# Patient Record
Sex: Female | Born: 1985 | Race: White | Hispanic: No | Marital: Married | Smoking: Never smoker
Health system: Southern US, Community
[De-identification: ages and names within clinical notes are randomized; demographics above are authoritative.]

## PROBLEM LIST (undated history)

## (undated) DIAGNOSIS — Z8619 Personal history of other infectious and parasitic diseases: Secondary | ICD-10-CM

## (undated) DIAGNOSIS — Z789 Other specified health status: Secondary | ICD-10-CM

## (undated) DIAGNOSIS — E785 Hyperlipidemia, unspecified: Secondary | ICD-10-CM

## (undated) HISTORY — DX: Personal history of other infectious and parasitic diseases: Z86.19

## (undated) HISTORY — DX: Hyperlipidemia, unspecified: E78.5

---

## 2006-03-11 ENCOUNTER — Other Ambulatory Visit: Admission: RE | Admit: 2006-03-11 | Discharge: 2006-03-11 | Payer: Self-pay | Admitting: Family Medicine

## 2007-03-13 ENCOUNTER — Other Ambulatory Visit: Admission: RE | Admit: 2007-03-13 | Discharge: 2007-03-13 | Payer: Self-pay | Admitting: Family Medicine

## 2008-04-14 ENCOUNTER — Other Ambulatory Visit: Admission: RE | Admit: 2008-04-14 | Discharge: 2008-04-14 | Payer: Self-pay | Admitting: Family Medicine

## 2009-05-10 ENCOUNTER — Other Ambulatory Visit: Admission: RE | Admit: 2009-05-10 | Discharge: 2009-05-10 | Payer: Self-pay | Admitting: Family Medicine

## 2010-05-29 ENCOUNTER — Other Ambulatory Visit: Admission: RE | Admit: 2010-05-29 | Discharge: 2010-05-29 | Payer: Self-pay | Admitting: Family Medicine

## 2013-06-08 ENCOUNTER — Other Ambulatory Visit (HOSPITAL_COMMUNITY)
Admission: RE | Admit: 2013-06-08 | Discharge: 2013-06-08 | Disposition: A | Discharge status: Home / Self Care | Payer: BC Managed Care – PPO | Source: Ambulatory Visit | Attending: Family Medicine | Admitting: Family Medicine

## 2013-06-08 ENCOUNTER — Other Ambulatory Visit: Payer: Self-pay | Admitting: Family Medicine

## 2013-06-08 DIAGNOSIS — Z1151 Encounter for screening for human papillomavirus (HPV): Secondary | ICD-10-CM | POA: Insufficient documentation

## 2013-06-08 DIAGNOSIS — Z01419 Encounter for gynecological examination (general) (routine) without abnormal findings: Secondary | ICD-10-CM | POA: Insufficient documentation

## 2013-11-05 HISTORY — PX: OTHER SURGICAL HISTORY: SHX169

## 2013-11-05 NOTE — L&D Delivery Note (Signed)
Delivery Note At 11:05 AM a viable female was delivered via Vaginal, Spontaneous Delivery (Presentation: Occiput Anterior).  APGAR: 9, 9; weight pending .   Placenta status: Intact, Spontaneous.  Cord: 3 vessels with the following complications: None.  Cord pH: n/a  Anesthesia: None  Episiotomy: None Lacerations: 3rd degree.  Hymenal ring lacerated in pt's right. Suture Repair: 2.0 3.0 chromic, 0.0 Vicryl Est. Blood Loss (mL): 450  I/O of bladder prior to repair with sterile technique.  100 cc returned. Rectal exam done pre and post repair.  No suture in rectum.  Mom to postpartum.  Baby to Couplet care / Skin to Skin.  Geryl RankinsVARNADO, Karim Aiello 08/08/2014, 11:49 AM

## 2014-01-01 LAB — OB RESULTS CONSOLE RUBELLA ANTIBODY, IGM: Rubella: IMMUNE

## 2014-01-01 LAB — OB RESULTS CONSOLE ANTIBODY SCREEN: Antibody Screen: NEGATIVE

## 2014-01-01 LAB — OB RESULTS CONSOLE HEPATITIS B SURFACE ANTIGEN: Hepatitis B Surface Ag: NEGATIVE

## 2014-01-01 LAB — OB RESULTS CONSOLE ABO/RH: RH Type: POSITIVE

## 2014-01-01 LAB — OB RESULTS CONSOLE RPR: RPR: NONREACTIVE

## 2014-01-01 LAB — OB RESULTS CONSOLE HIV ANTIBODY (ROUTINE TESTING): HIV: NONREACTIVE

## 2014-01-07 LAB — OB RESULTS CONSOLE GC/CHLAMYDIA
Chlamydia: NEGATIVE
Gonorrhea: NEGATIVE

## 2014-08-06 ENCOUNTER — Telehealth (HOSPITAL_COMMUNITY): Payer: Self-pay | Admitting: *Deleted

## 2014-08-06 ENCOUNTER — Encounter (HOSPITAL_COMMUNITY): Payer: Self-pay | Admitting: *Deleted

## 2014-08-06 LAB — OB RESULTS CONSOLE GBS: GBS: NEGATIVE

## 2014-08-06 NOTE — Telephone Encounter (Signed)
Preadmission screen  

## 2014-08-06 NOTE — H&P (Signed)
HPI: 28 y/o G1P0 @ 7257w0d estimated gestational age (as dated by LMP c/w 10 week ultrasound) presents for IOL due to postdates.   no Leaking of Fluid,   no Vaginal Bleeding,   irregular Uterine Contractions,  + Fetal Movement.  ROS: no HA, no epigastric pain, no visual changes.    Pregnancy complicated by: 1) ALL PCN/Ceclor   Prenatal Transfer Tool  Maternal Diabetes: No Genetic Screening: Normal Maternal Ultrasounds/Referrals: Abnormal:  Findings:   Isolated choroid plexus cyst Fetal Ultrasounds or other Referrals:  None Maternal Substance Abuse:  No Significant Maternal Medications:  None Significant Maternal Lab Results: Lab values include: Group B Strep negative   PNL:  GBS negative, Rub Immune, Hep B neg, RPR NR, HIV neg, GC/C neg (March 2015), glucola:82 Hgb 12.2, Received Tdap on 05/26/14.   Blood type: A positive  OBHx: primip PMHx:  none Meds:  PNV Allergy:  Allergies  Allergen Reactions  . Amoxicillin   . Ceclor [Cefaclor]    SurgHx: none SocHx:   no Tobacco, no  EtOH, no Illicit Drugs  O: LMP 10/26/2013 Examination performed in office on 10/1 BP: 122/87 Gen. AAOx3, NAD CV.  RRR  No murmur.  Resp. CTAB, no wheeze or crackles. Abd. Gravid,  no tenderness,  no rigidity,  no guarding Extr.  1+ non-pitting edema B/L , no calf tenderness bilaterally  FHT: 135bpm SVE: 3/75/-2, vertex  Labs: see orders  A/P:  28 y.o. G1P0 @ 10557w0d EGA who presents for induction of labor. -FWB- reassuring by doppler -Induction of labor: will induce with Pitocin -GBS: negative -CBC, T&S to be collected  Myna HidalgoJennifer Jerre Diguglielmo, DO [phone] (pager) [phone] (office)

## 2014-08-08 ENCOUNTER — Encounter (HOSPITAL_COMMUNITY): Payer: BC Managed Care – PPO | Admitting: Anesthesiology

## 2014-08-08 ENCOUNTER — Inpatient Hospital Stay (HOSPITAL_COMMUNITY): Payer: BC Managed Care – PPO | Admitting: Anesthesiology

## 2014-08-08 ENCOUNTER — Encounter (HOSPITAL_COMMUNITY): Payer: Self-pay | Admitting: *Deleted

## 2014-08-08 ENCOUNTER — Inpatient Hospital Stay (HOSPITAL_COMMUNITY)
Admission: AD | Admit: 2014-08-08 | Discharge: 2014-08-10 | DRG: 989 | Disposition: A | Discharge status: Home / Self Care | Payer: BC Managed Care – PPO | Source: Ambulatory Visit | Attending: Obstetrics & Gynecology | Admitting: Obstetrics & Gynecology

## 2014-08-08 DIAGNOSIS — Z3403 Encounter for supervision of normal first pregnancy, third trimester: Secondary | ICD-10-CM | POA: Diagnosis present

## 2014-08-08 DIAGNOSIS — Z3A40 40 weeks gestation of pregnancy: Secondary | ICD-10-CM | POA: Diagnosis present

## 2014-08-08 HISTORY — DX: Other specified health status: Z78.9

## 2014-08-08 LAB — CBC
HCT: 39.4 % (ref 36.0–46.0)
Hemoglobin: 13.9 g/dL (ref 12.0–15.0)
MCH: 31.2 pg (ref 26.0–34.0)
MCHC: 35.3 g/dL (ref 30.0–36.0)
MCV: 88.3 fL (ref 78.0–100.0)
Platelets: 181 10*3/uL (ref 150–400)
RBC: 4.46 MIL/uL (ref 3.87–5.11)
RDW: 13 % (ref 11.5–15.5)
WBC: 10.3 10*3/uL (ref 4.0–10.5)

## 2014-08-08 MED ORDER — FENTANYL 2.5 MCG/ML BUPIVACAINE 1/10 % EPIDURAL INFUSION (WH - ANES)
INTRAMUSCULAR | Status: DC | PRN
  Administered 2014-08-08: 14 mL/h via EPIDURAL

## 2014-08-08 MED ORDER — IBUPROFEN 600 MG PO TABS
600.0000 mg | ORAL_TABLET | Freq: Four times a day (QID) | ORAL | Status: DC
  Administered 2014-08-08 – 2014-08-10 (×8): 600 mg via ORAL
  Filled 2014-08-08 (×8): qty 1

## 2014-08-08 MED ORDER — MAGNESIUM HYDROXIDE 400 MG/5ML PO SUSP: 30.0000 mL | ORAL | Status: DC | PRN

## 2014-08-08 MED ORDER — TERBUTALINE SULFATE 1 MG/ML IJ SOLN: 0.2500 mg | Freq: Once | INTRAMUSCULAR | Status: DC | PRN

## 2014-08-08 MED ORDER — LANOLIN HYDROUS EX OINT: TOPICAL_OINTMENT | CUTANEOUS | Status: DC | PRN

## 2014-08-08 MED ORDER — CITRIC ACID-SODIUM CITRATE 334-500 MG/5ML PO SOLN: 30.0000 mL | ORAL | Status: DC | PRN

## 2014-08-08 MED ORDER — WITCH HAZEL-GLYCERIN EX PADS: 1.0000 "application " | MEDICATED_PAD | CUTANEOUS | Status: DC | PRN

## 2014-08-08 MED ORDER — LACTATED RINGERS IV SOLN
INTRAVENOUS | Status: DC
Start: 2014-08-08 — End: 2014-08-08
  Administered 2014-08-08 (×2): via INTRAVENOUS

## 2014-08-08 MED ORDER — DIBUCAINE 1 % RE OINT: 1.0000 "application " | TOPICAL_OINTMENT | RECTAL | Status: DC | PRN

## 2014-08-08 MED ORDER — OXYTOCIN BOLUS FROM INFUSION: 500.0000 mL | INTRAVENOUS | Status: DC

## 2014-08-08 MED ORDER — PHENYLEPHRINE 40 MCG/ML (10ML) SYRINGE FOR IV PUSH (FOR BLOOD PRESSURE SUPPORT)
80.0000 ug | PREFILLED_SYRINGE | INTRAVENOUS | Status: DC | PRN
  Filled 2014-08-08: qty 2

## 2014-08-08 MED ORDER — INFLUENZA VAC SPLIT QUAD 0.5 ML IM SUSY: 0.5000 mL | PREFILLED_SYRINGE | INTRAMUSCULAR | Status: DC

## 2014-08-08 MED ORDER — OXYTOCIN 40 UNITS IN LACTATED RINGERS INFUSION - SIMPLE MED
1.0000 m[IU]/min | INTRAVENOUS | Status: DC
  Administered 2014-08-08: 2 m[IU]/min via INTRAVENOUS

## 2014-08-08 MED ORDER — LIDOCAINE HCL (PF) 1 % IJ SOLN
30.0000 mL | INTRAMUSCULAR | Status: DC | PRN
  Filled 2014-08-08: qty 30

## 2014-08-08 MED ORDER — OXYCODONE-ACETAMINOPHEN 5-325 MG PO TABS
2.0000 | ORAL_TABLET | ORAL | Status: DC | PRN
  Filled 2014-08-08: qty 2

## 2014-08-08 MED ORDER — DIPHENHYDRAMINE HCL 50 MG/ML IJ SOLN: 12.5000 mg | INTRAMUSCULAR | Status: DC | PRN

## 2014-08-08 MED ORDER — DIPHENHYDRAMINE HCL 25 MG PO CAPS: 25.0000 mg | ORAL_CAPSULE | Freq: Four times a day (QID) | ORAL | Status: DC | PRN

## 2014-08-08 MED ORDER — ACETAMINOPHEN 325 MG PO TABS: 650.0000 mg | ORAL_TABLET | ORAL | Status: DC | PRN

## 2014-08-08 MED ORDER — OXYTOCIN 40 UNITS IN LACTATED RINGERS INFUSION - SIMPLE MED: 62.5000 mL/h | INTRAVENOUS | Status: DC | PRN

## 2014-08-08 MED ORDER — SENNOSIDES-DOCUSATE SODIUM 8.6-50 MG PO TABS
2.0000 | ORAL_TABLET | ORAL | Status: DC
  Administered 2014-08-08 – 2014-08-09 (×2): 2 via ORAL
  Filled 2014-08-08 (×2): qty 2

## 2014-08-08 MED ORDER — BENZOCAINE-MENTHOL 20-0.5 % EX AERO
1.0000 "application " | INHALATION_SPRAY | CUTANEOUS | Status: DC | PRN
  Administered 2014-08-08 – 2014-08-10 (×2): 1 via TOPICAL
  Filled 2014-08-08 (×2): qty 56

## 2014-08-08 MED ORDER — ZOLPIDEM TARTRATE 5 MG PO TABS: 5.0000 mg | ORAL_TABLET | Freq: Every evening | ORAL | Status: DC | PRN

## 2014-08-08 MED ORDER — PHENYLEPHRINE 40 MCG/ML (10ML) SYRINGE FOR IV PUSH (FOR BLOOD PRESSURE SUPPORT)
80.0000 ug | PREFILLED_SYRINGE | INTRAVENOUS | Status: DC | PRN
  Filled 2014-08-08: qty 2
  Filled 2014-08-08: qty 10

## 2014-08-08 MED ORDER — PRENATAL MULTIVITAMIN CH
1.0000 | ORAL_TABLET | Freq: Every day | ORAL | Status: DC
  Administered 2014-08-10: 1 via ORAL
  Filled 2014-08-08: qty 1

## 2014-08-08 MED ORDER — METHYLERGONOVINE MALEATE 0.2 MG PO TABS: 0.2000 mg | ORAL_TABLET | ORAL | Status: DC | PRN

## 2014-08-08 MED ORDER — EPHEDRINE 5 MG/ML INJ
10.0000 mg | INTRAVENOUS | Status: DC | PRN
  Filled 2014-08-08: qty 2

## 2014-08-08 MED ORDER — TETANUS-DIPHTH-ACELL PERTUSSIS 5-2.5-18.5 LF-MCG/0.5 IM SUSP: 0.5000 mL | Freq: Once | INTRAMUSCULAR | Status: DC

## 2014-08-08 MED ORDER — METHYLERGONOVINE MALEATE 0.2 MG/ML IJ SOLN: 0.2000 mg | INTRAMUSCULAR | Status: DC | PRN

## 2014-08-08 MED ORDER — BUTORPHANOL TARTRATE 1 MG/ML IJ SOLN
1.0000 mg | Freq: Once | INTRAMUSCULAR | Status: AC
  Administered 2014-08-08: 1 mg via INTRAVENOUS
  Filled 2014-08-08: qty 1

## 2014-08-08 MED ORDER — OXYTOCIN 40 UNITS IN LACTATED RINGERS INFUSION - SIMPLE MED: 1.0000 m[IU]/min | INTRAVENOUS | Status: DC

## 2014-08-08 MED ORDER — SIMETHICONE 80 MG PO CHEW: 80.0000 mg | CHEWABLE_TABLET | ORAL | Status: DC | PRN

## 2014-08-08 MED ORDER — ONDANSETRON HCL 4 MG/2ML IJ SOLN: 4.0000 mg | INTRAMUSCULAR | Status: DC | PRN

## 2014-08-08 MED ORDER — OXYCODONE-ACETAMINOPHEN 5-325 MG PO TABS: 2.0000 | ORAL_TABLET | ORAL | Status: DC | PRN

## 2014-08-08 MED ORDER — ONDANSETRON HCL 4 MG PO TABS: 4.0000 mg | ORAL_TABLET | ORAL | Status: DC | PRN

## 2014-08-08 MED ORDER — HYDROXYZINE HCL 50 MG PO TABS
50.0000 mg | ORAL_TABLET | Freq: Four times a day (QID) | ORAL | Status: DC | PRN
  Filled 2014-08-08: qty 1

## 2014-08-08 MED ORDER — FENTANYL 2.5 MCG/ML BUPIVACAINE 1/10 % EPIDURAL INFUSION (WH - ANES): 14.0000 mL/h | INTRAMUSCULAR | Status: DC | PRN

## 2014-08-08 MED ORDER — ONDANSETRON HCL 4 MG/2ML IJ SOLN
4.0000 mg | Freq: Four times a day (QID) | INTRAMUSCULAR | Status: DC | PRN
  Filled 2014-08-08: qty 2

## 2014-08-08 MED ORDER — LIDOCAINE HCL (PF) 1 % IJ SOLN
INTRAMUSCULAR | Status: DC | PRN
  Administered 2014-08-08 (×2): 4 mL

## 2014-08-08 MED ORDER — LACTATED RINGERS IV SOLN
500.0000 mL | Freq: Once | INTRAVENOUS | Status: AC
  Administered 2014-08-08: 500 mL via INTRAVENOUS

## 2014-08-08 MED ORDER — LACTATED RINGERS IV SOLN
500.0000 mL | INTRAVENOUS | Status: DC | PRN
  Administered 2014-08-08: 500 mL via INTRAVENOUS

## 2014-08-08 MED ORDER — OXYCODONE-ACETAMINOPHEN 5-325 MG PO TABS: 1.0000 | ORAL_TABLET | ORAL | Status: DC | PRN

## 2014-08-08 MED ORDER — FENTANYL 2.5 MCG/ML BUPIVACAINE 1/10 % EPIDURAL INFUSION (WH - ANES)
14.0000 mL/h | INTRAMUSCULAR | Status: DC | PRN
  Filled 2014-08-08: qty 125

## 2014-08-08 MED ORDER — OXYTOCIN 40 UNITS IN LACTATED RINGERS INFUSION - SIMPLE MED
62.5000 mL/h | INTRAVENOUS | Status: DC
  Filled 2014-08-08: qty 1000

## 2014-08-08 MED ORDER — OXYCODONE-ACETAMINOPHEN 5-325 MG PO TABS
1.0000 | ORAL_TABLET | ORAL | Status: DC | PRN
  Administered 2014-08-08 – 2014-08-10 (×6): 1 via ORAL
  Filled 2014-08-08 (×6): qty 1

## 2014-08-08 NOTE — MAU Note (Signed)
PT ARRIVED IN LOBBY - VERY UNCOMFORTABLE-  TO RM 7.  FHR- 129.  SROM  AT 0115- CLEAR FLUID.   GBS- NEG.  VE- 8 CM  90 %, VTX  , 0 STATION.  TO RM 168- VIA STRETCHER.

## 2014-08-08 NOTE — H&P (Signed)
Cynthia HarpinKristin Farmer is a 28 y.o. female G1 at 6040 6/7 weeks presenting with c/o LOF.  Pt had a few contractions overnight but was able to go to sleep.  She was awakened by LOF.  Noted vaginal bleeding/spotting.  Contractions are very painful.   Pt's birth plan was to labor without IV medication or an epidural.  Pt currently declines both.  PNC uncomplicated per Dr. Charlotta Newtonzan.  She was scheduled for IOL tomorrow.  Please see Dr. Lawana Chamberszan's H&P for additional details. Maternal Medical History:  Reason for admission: Rupture of membranes.   Contractions: Onset was 1-2 hours ago.   Frequency: regular.   Perceived severity is strong.    Prenatal complications: no prenatal complications Prenatal Complications - Diabetes: none.    OB History   Grav Para Term Preterm Abortions TAB SAB Ect Mult Living   2              Past Medical History  Diagnosis Date  . Hx of varicella   . Medical history non-contributory    History reviewed. No pertinent past surgical history. Family History: family history includes Heart disease in her father, maternal grandfather, and paternal grandfather. Social History:  reports that she has never smoked. She has never used smokeless tobacco. She reports that she does not drink alcohol or use illicit drugs.   Prenatal Transfer Tool  Maternal Diabetes: No Genetic Screening: Normal Maternal Ultrasounds/Referrals: Normal Fetal Ultrasounds or other Referrals:  None Maternal Substance Abuse:  No Significant Maternal Medications:  None Significant Maternal Lab Results:  Lab values include: Group B Strep negative Other Comments:  None  Review of Systems  HENT: Positive for congestion.   Gastrointestinal: Positive for abdominal pain.  Genitourinary:       Spotting   Cervix slightly thicker on her right side. Dilation: 7 Effacement (%): 90 Station: +1 Exam by:: Dr. Dion BodyVarnado Blood pressure 132/80, pulse 87, temperature 98.2 F (36.8 C), temperature source Oral, resp. rate  20, height 5\' 7"  (1.702 m), weight 74.844 kg (165 lb), last menstrual period 10/26/2013. Maternal Exam:  Uterine Assessment: Contraction strength is firm.  Contraction frequency is regular.   Abdomen: Estimated fetal weight is 6.5-7 lbs.   Fetal presentation: vertex  Introitus: Normal vulva. Ferning test: not done.  Amniotic fluid character: clear.  Pelvis: adequate for delivery.   Cervix: Cervix evaluated by digital exam.     Fetal Exam Fetal Monitor Review: Variability: moderate (6-25 bpm).   Pattern: accelerations present.    Fetal State Assessment: Category I - tracings are normal.     Physical Exam  Constitutional: She is oriented to person, place, and time. She appears well-developed and well-nourished. She appears distressed.  HENT:  Head: Normocephalic and atraumatic.  Eyes: EOM are normal.  Neck: Normal range of motion.  Cardiovascular: Normal rate and regular rhythm.   Respiratory: Effort normal and breath sounds normal. No respiratory distress.  GI: There is no tenderness.  Musculoskeletal: Normal range of motion. She exhibits no edema and no tenderness.  Neurological: She is alert and oriented to person, place, and time.  Skin: Skin is warm and dry.  Psychiatric: She has a normal mood and affect.    Prenatal labs: ABO, Rh: A/Positive/-- (02/27 0000) Antibody: Negative (02/27 0000) Rubella: Immune (02/27 0000) RPR: Nonreactive (02/27 0000)  HBsAg: Negative (02/27 0000)  HIV: Non-reactive (02/27 0000)  GBS: Negative (10/02 0000)   Assessment/Plan: IUP @ 40 6/7 weeks. Active Labor. Reassuring fetal status. Expectant management. IV medications or  epidural if pt reconsiders.   Cynthia Farmer 08/08/2014, 3:20 AM

## 2014-08-08 NOTE — Anesthesia Procedure Notes (Signed)
Epidural Patient location during procedure: OB  Staffing Anesthesiologist: Rosaline Ezekiel R Performed by: anesthesiologist   Preanesthetic Checklist Completed: patient identified, pre-op evaluation, timeout performed, IV checked, risks and benefits discussed and monitors and equipment checked  Epidural Patient position: sitting Prep: site prepped and draped and DuraPrep Patient monitoring: heart rate Approach: midline Location: L3-L4 Injection technique: LOR air and LOR saline  Needle:  Needle type: Tuohy  Needle gauge: 17 G Needle length: 9 cm Needle insertion depth: 5 cm Catheter type: closed end flexible Catheter size: 19 Gauge Catheter at skin depth: 11 cm Test dose: negative  Assessment Sensory level: T8 Events: blood not aspirated, injection not painful, no injection resistance, negative IV test and no paresthesia  Additional Notes Reason for block:procedure for pain   

## 2014-08-08 NOTE — Lactation Note (Signed)
This note was copied from the chart of Cynthia Farmer. Lactation Consultation Note Initial visit at 8 hours of age.  MBU RN reports a good latch, but mom complains of nipple pain.  Mom is able to demonstrate hand expression with colostrum visible.  Baby repositioned to football hold on right breast with pillow support.  Baby opens mouth wide for latch with flanged lips.  At first baby appears to chomp at the breast and slows into a more rhythmic sucking.  Few swallows heard and stimulation needed to continue sucking.  Baby maintained latch for about 7 minutes and stopped sucking, mom unlatched baby to prevent further damage to nipple.  Nipple is a little blistered on tip.  Mom continued to rate pain about "2-4" during feeding.  Oral assessment with gloved finger reveals a cleft on tip of tongue and a blanch posterior frenulum appears tight.  Baby tongue thrusts and is not well coordinated, but sleepy.  Encouraged mom to continue to work on deep latch and to call for assistance with positioning as needed.   Further assessment of tongue is warranted due to findings and mom maybe a candidate for nipple shield due to soreness.  [facility] given and discussed.  Encouraged to feed with early cues on demand.  Early newborn behavior discussed.  Mom to call for assist as needed.       Patient Name: Cynthia Farmer WUJWJ'XToday's Date: 08/08/2014 Reason for consult: Initial assessment   Maternal Data Has patient been taught Hand Expression?: Yes Does the patient have breastfeeding experience prior to this delivery?: No  Feeding Feeding Type: Breast Fed Length of feed: 7 min  LATCH Score/Interventions Latch: Repeated attempts needed to sustain latch, nipple held in mouth throughout feeding, stimulation needed to elicit sucking reflex. Intervention(s): Adjust position;Assist with latch;Breast massage;Breast compression  Audible Swallowing: A few with stimulation Intervention(s): Skin to skin;Hand  expression Intervention(s): Skin to skin;Hand expression;Alternate breast massage  Type of Nipple: Everted at rest and after stimulation  Comfort (Breast/Nipple): Filling, red/small blisters or bruises, mild/mod discomfort  Problem noted: Mild/Moderate discomfort  Hold (Positioning): Assistance needed to correctly position infant at breast and maintain latch. Intervention(s): Skin to skin;Position options;Support Pillows;Breastfeeding basics reviewed  LATCH Score: 6  Lactation Tools Discussed/Used     Consult Status Consult Status: Follow-up Date: 08/09/14 Follow-up type: In-patient    Beverely RisenShoptaw, Arvella MerlesJana Lynn 08/08/2014, 7:34 PM

## 2014-08-08 NOTE — Anesthesia Preprocedure Evaluation (Signed)
Anesthesia Evaluation  Patient identified by MRN, date of birth, ID band Patient awake    Reviewed: Allergy & Precautions, H&P , NPO status , Patient's Chart, lab work & pertinent test results  Airway Mallampati: II TM Distance: >3 FB Neck ROM: Full    Dental no notable dental hx.    Pulmonary neg pulmonary ROS,  breath sounds clear to auscultation  Pulmonary exam normal       Cardiovascular negative cardio ROS  Rhythm:Regular Rate:Normal     Neuro/Psych negative neurological ROS  negative psych ROS   GI/Hepatic negative GI ROS, Neg liver ROS,   Endo/Other  negative endocrine ROS  Renal/GU negative Renal ROS     Musculoskeletal negative musculoskeletal ROS (+)   Abdominal   Peds  Hematology negative hematology ROS (+)   Anesthesia Other Findings   Reproductive/Obstetrics (+) Pregnancy                           Anesthesia Physical Anesthesia Plan  ASA: II  Anesthesia Plan: Epidural   Post-op Pain Management:    Induction:   Airway Management Planned:   Additional Equipment:   Intra-op Plan:   Post-operative Plan:   Informed Consent: I have reviewed the patients History and Physical, chart, labs and discussed the procedure including the risks, benefits and alternatives for the proposed anesthesia with the patient or authorized representative who has indicated his/her understanding and acceptance.     Plan Discussed with:   Anesthesia Plan Comments:         Anesthesia Quick Evaluation  

## 2014-08-08 NOTE — Anesthesia Postprocedure Evaluation (Signed)
  Anesthesia Post-op Note  Patient: Cynthia HarpinKristin Fleishman  Procedure(s) Performed: * No procedures listed *  Patient Location: Mother/Baby  Anesthesia Type:Epidural  Level of Consciousness: awake  Airway and Oxygen Therapy: Patient Spontanous Breathing  Post-op Pain: mild  Post-op Assessment: Patient's Cardiovascular Status Stable and Respiratory Function Stable  Post-op Vital Signs: stable  Last Vitals:  Filed Vitals:   08/08/14 1430  BP: 116/59  Pulse: 90  Temp: 36.7 C  Resp: 16    Complications: No apparent anesthesia complications

## 2014-08-08 NOTE — Progress Notes (Addendum)
Dr Dion BodyVarnado notified of pt's admission and status. Aware of sve of 8cm and pt very uncomfortable and srom with clear fld.

## 2014-08-08 NOTE — Progress Notes (Signed)
Pt does not have pain with epidural but her legs are tingling, which worries her.  Pt admits to h/o anxiety and requests a pulse oximeter, hoping that it will alarm if something is wrong.  She gets lightheaded when she worries.  Pt has not been able to sleep since the epidural was placed.  Pt may consider anxiety medication. VSS Gen:  NAD, anxious, not tearful CVX: 8-9/90/+2, edematous on right side Fetal movement felt when IUPC placed EM:  +ltv, no decels, accels but not reactive.  Contractions q 2-4  A/ Active Labor, protracted Anxiety Fetal status reassuring  P/ Started on Pitcon b/c contractions spaced out after epidural. IUPC placed to determine adequate MVUs. Atarax 50 mg prn anxiety but would like more accels prior to administration.  IVF Bolus ordered.

## 2014-08-09 ENCOUNTER — Inpatient Hospital Stay (HOSPITAL_COMMUNITY)
Admission: RE | Admit: 2014-08-09 | Discharge: 2014-08-09 | Disposition: A | Discharge status: Home / Self Care | Payer: BC Managed Care – PPO | Source: Ambulatory Visit | Attending: Obstetrics & Gynecology | Admitting: Obstetrics & Gynecology

## 2014-08-09 LAB — CBC
HCT: 31.8 % — ABNORMAL LOW (ref 36.0–46.0)
Hemoglobin: 10.9 g/dL — ABNORMAL LOW (ref 12.0–15.0)
MCH: 31 pg (ref 26.0–34.0)
MCHC: 34.3 g/dL (ref 30.0–36.0)
MCV: 90.3 fL (ref 78.0–100.0)
Platelets: 159 10*3/uL (ref 150–400)
RBC: 3.52 MIL/uL — ABNORMAL LOW (ref 3.87–5.11)
RDW: 13.3 % (ref 11.5–15.5)
WBC: 12.9 10*3/uL — ABNORMAL HIGH (ref 4.0–10.5)

## 2014-08-09 LAB — RPR

## 2014-08-09 NOTE — Progress Notes (Signed)
Postpartum Note Day # 1  S:  Patient resting comfortable in bed.  Pain controlled, reporting some vaginal soreness.  Tolerating general. + flatus, no BM.  Lochia moderate.  Ambulating without difficulty.  She denies n/v/f/c, SOB, or CP.  Pt plans on breastfeeding.  O: Temp:  [97.5 F (36.4 C)-98.8 F (37.1 C)] 98.5 F (36.9 C) (10/05 0546) Pulse Rate:  [64-119] 71 (10/05 0546) Resp:  [16-20] 18 (10/05 0546) BP: (99-151)/(59-89) 99/63 mmHg (10/05 0546) SpO2:  [94 %-100 %] 99 % (10/04 1800) Gen: A&Ox3, NAD CV: RRR, no MRG Resp: CTAB Abdomen: soft, NT Uterus: firm, non-tender, below umbilicus Ext: No edema, no calf tenderness bilaterally  Labs:  Recent Labs  08/08/14 0225 08/09/14 0620  HGB 13.9 10.9*    A/P: Pt is a 28 y.o. G1P1001 s/p NSVD with 3rd degree laceration.  PPD#1  - Pain controlled -GU: UOP is adequate, encouraged fiber diet to avoid straining for BM -GI: Tolerating general diet -Prophylaxis: early ambulation -Labs: stable as above  DISPO: Plan for discharge home on postpartum Day #2, continue routine postpartum care.  Myna HidalgoJennifer Perrin Eddleman, DO [phone] (pager) [phone] (office)

## 2014-08-09 NOTE — Progress Notes (Signed)
Spoke to lab tech, stated that the RPR was collected this morning, but no results as of now.  Sample will be sent out to be completed and results will be posted when complete.

## 2014-08-10 MED ORDER — IBUPROFEN 600 MG PO TABS: 600.0000 mg | ORAL_TABLET | Freq: Four times a day (QID) | ORAL | Status: DC

## 2014-08-10 MED ORDER — DOCUSATE SODIUM 100 MG PO CAPS: 100.0000 mg | ORAL_CAPSULE | Freq: Two times a day (BID) | ORAL | Status: DC

## 2014-08-10 NOTE — Lactation Note (Signed)
This note was copied from the chart of Girl Estell HarpinKristin Robins. Lactation Consultation Note: Follow up visit with mom before DC. Mom reports that baby just finished feeding for 15 min. Baby asleep in mom's arms. Reports that her nipples are sore- about the same as yesterday. Both nipples pink. Comfort gels given with instructions for use. Reviewed wide open mouth and getting hte baby deep onto the breast. Reports breasts are feeling a little fuller this morning,  Mom does not have pump at home and her insurance does not cover pumps. Manual pump given with instructions for use and cleaning., Discussed engorgement prevention and treatment., No questions at present. Reviewed BFSG and OP appointments as resources for support after DC.   Patient Name: Girl Estell HarpinKristin Zanni OZHYQ'MToday's Date: 08/10/2014 Reason for consult: Follow-up assessment   Maternal Data Formula Feeding for Exclusion: No Has patient been taught Hand Expression?: Yes Does the patient have breastfeeding experience prior to this delivery?: No  Feeding Feeding Type: Breast Fed Length of feed: 15 min  LATCH Score/Interventions          Comfort (Breast/Nipple): Filling, red/small blisters or bruises, mild/mod discomfort  Problem noted: Mild/Moderate discomfort Interventions (Mild/moderate discomfort): Comfort gels;Hand expression        Lactation Tools Discussed/Used     Consult Status Consult Status: Complete    Pamelia HoitWeeks, Daily Doe D 08/10/2014, 11:50 AM

## 2014-08-10 NOTE — Discharge Instructions (Signed)
Vaginal Delivery, Care After Refer to this sheet in the next few weeks. These discharge instructions provide you with information on caring for yourself after delivery. Your caregiver may also give you specific instructions. Your treatment has been planned according to the most current medical practices available, but problems sometimes occur. Call your caregiver if you have any problems or questions after you go home. HOME CARE INSTRUCTIONS  Take over-the-counter or prescription medicines only as directed by your caregiver or pharmacist.  For pain management take Motrin every 6 hours as needed.  To avoid constipation, please use the stool softener twice daily and any over the counter mild laxative as needed.  Do not drink alcohol, especially if you are breastfeeding or taking medicine to relieve pain.  Do not chew or smoke tobacco.  Do not use illegal drugs.  Continue to use good perineal care. Good perineal care includes:  Wiping your perineum from front to back.  Keeping your perineum clean.  Do not use tampons or douche until your caregiver says it is okay.  Shower, wash your hair, and take tub baths as directed by your caregiver.  Wear a well-fitting bra that provides breast support.  Eat healthy foods.  Drink enough fluids to keep your urine clear or pale yellow.  Eat high-fiber foods such as whole grain cereals and breads, brown rice, beans, and fresh fruits and vegetables every day. These foods may help prevent or relieve constipation.  Follow your caregiver's recommendations regarding resumption of activities such as climbing stairs, driving, lifting, exercising, or traveling.  Talk to your caregiver about resuming sexual activities. Resumption of sexual activities is dependent upon your risk of infection, your rate of healing, and your comfort and desire to resume sexual activity.  Try to have someone help you with your household activities and your newborn for at least a  few days after you leave the hospital.  Rest as much as possible. Try to rest or take a nap when your newborn is sleeping.  Increase your activities gradually.  Keep all of your scheduled postpartum appointments. It is very important to keep your scheduled follow-up appointments. At these appointments, your caregiver will be checking to make sure that you are healing physically and emotionally. SEEK MEDICAL CARE IF:   You are passing large clots from your vagina. Save any clots to show your caregiver.  You have a foul smelling discharge from your vagina.  You have trouble urinating.  You are urinating frequently.  You have pain when you urinate.  You have a change in your bowel movements.  You have increasing redness, pain, or swelling near your vaginal incision (episiotomy) or vaginal tear.  You have pus draining from your episiotomy or vaginal tear.  Your episiotomy or vaginal tear is separating.  You have painful, hard, or reddened breasts.  You have a severe headache.  You have blurred vision or see spots.  You feel sad or depressed.  You have thoughts of hurting yourself or your newborn.  You have questions about your care, the care of your newborn, or medicines.  You are dizzy or light-headed.  You have a rash.  You have nausea or vomiting.  You were breastfeeding and have not had a menstrual period within 12 weeks after you stopped breastfeeding.  You are not breastfeeding and have not had a menstrual period by the 12th week after delivery.  You have a fever. SEEK IMMEDIATE MEDICAL CARE IF:   You have persistent pain.  You have  chest pain.  You have shortness of breath.  You faint.  You have leg pain.  You have stomach pain.  Your vaginal bleeding saturates two or more sanitary pads in 1 hour. MAKE SURE YOU:   Understand these instructions.  Will watch your condition.  Will get help right away if you are not doing well or get  worse. Document Released: 10/19/2000 Document Revised: 03/08/2014 Document Reviewed: 06/18/2012 [facility] Patient Information 2015 [place] [place]. This information is not intended to replace advice given to you by your health care provider. Make sure you discuss any questions you have with your health care provider.

## 2014-08-10 NOTE — Lactation Note (Signed)
This note was copied from the chart of Cynthia Estell HarpinKristin Cope. Lactation Consultation Note  Patient Name: Cynthia Farmer ZOXWR'UToday's Date: 08/10/2014 Reason for consult: Follow-up assessment;Breast/nipple pain Mom reports PS=8 with latching baby. LC assisted Mom with positioning to obtain more depth with latch. With nursing Mom reports less pain but pain does not resolve. Demonstrated how to bring bottom lip down to parents,  baby can sustain wide gape, audible swallows noted. Baby is noted to have anterior frenulum. Does appear to be stretchable however it Mom continues to have pain with nursing in spite of deep latch, the frenulum could be increasing Mom's discomfort. Care for sore nipples reviewed with Mom, comfort gels given with instructions. No breakdown noted with exam. Advised Mom if nipple pain does not improve with techniques learned today and she would be interested in referral for revision of frenulum, talk to Peds at 2 day visit. OP f/u with lactation scheduled for Friday, 08/13/14 at 0900.   Maternal Data Formula Feeding for Exclusion: No Has patient been taught Hand Expression?: Yes Does the patient have breastfeeding experience prior to this delivery?: No  Feeding Feeding Type: Breast Fed Length of feed: 15 min  LATCH Score/Interventions Latch: Grasps breast easily, tongue down, lips flanged, rhythmical sucking. Intervention(s): Adjust position;Assist with latch;Breast massage;Breast compression  Audible Swallowing: Spontaneous and intermittent  Type of Nipple: Everted at rest and after stimulation  Comfort (Breast/Nipple): Filling, red/small blisters or bruises, mild/mod discomfort  Problem noted: Severe discomfort Interventions (Mild/moderate discomfort): Comfort gels;Hand expression  Hold (Positioning): Assistance needed to correctly position infant at breast and maintain latch. Intervention(s): Breastfeeding basics reviewed;Support Pillows;Position options;Skin to  skin  LATCH Score: 8  Lactation Tools Discussed/Used     Consult Status Consult Status: Complete Date: 08/10/14 Follow-up type: In-patient    Alfred LevinsGranger, Claude Waldman Ann 08/10/2014, 1:21 PM

## 2014-08-10 NOTE — Progress Notes (Signed)
Postpartum Note Day # 2  S:  Patient resting comfortable in bed.  Pain controlled, reporting some vaginal soreness.  Tolerating general. + flatus, no BM.  Lochia moderate.  Ambulating without difficulty.  She denies n/v/f/c, SOB, or CP.  Pt plans on breastfeeding.  O: Temp:  [97.4 F (36.3 C)-98.3 F (36.8 C)] 98.3 F (36.8 C) (10/06 0543) Pulse Rate:  [86-90] 86 (10/06 0543) Resp:  [18-20] 20 (10/06 0543) BP: (119-121)/(68-83) 119/68 mmHg (10/06 0543) Gen: A&Ox3, NAD CV: RRR, no MRG Resp: CTAB Abdomen: soft, NT Uterus: firm, non-tender, below umbilicus Ext: No edema, no calf tenderness bilaterally  Labs:   Recent Labs  08/08/14 0225 08/09/14 0620  HGB 13.9 10.9*    A/P: Pt is a 28 y.o. G1P1001 s/p NSVD with 3rd degree laceration.  PPD#2  - Pain controlled -GU: UOP is adequate, encouraged fiber diet to avoid straining for BM.  Pt given colace for home to take twice daily, ok to use mild laxative for BM -GI: Tolerating general diet -Prophylaxis: early ambulation -Labs: stable as above  DISPO: Plan for discharge home today.  Myna HidalgoJennifer Roda Lauture, DO [phone]) [phone] (office)

## 2014-08-12 ENCOUNTER — Inpatient Hospital Stay (HOSPITAL_COMMUNITY): Admission: RE | Admit: 2014-08-12 | Payer: BC Managed Care – PPO | Source: Ambulatory Visit

## 2014-08-12 NOTE — Discharge Summary (Signed)
Obstetric Discharge Summary Reason for Admission: onset of labor and rupture of membranes Prenatal Procedures: none Intrapartum Procedures: spontaneous vaginal delivery Postpartum Procedures: none Complications-Operative and Postpartum: 3rd degree perineal laceration Date of Admission: 08/08/14 Date of Discharge: 08/10/14 Hemoglobin  Date Value Ref Range Status  08/09/2014 10.9* 12.0 - 15.0 g/dL Final     DELTA CHECK NOTED     REPEATED TO VERIFY     HCT  Date Value Ref Range Status  08/09/2014 31.8* 36.0 - 46.0 % Final   Hospital Course: Cynthia HarpinKristin Farmer is a 28 y.o. female G1 at 2640 6/7 weeks presenting with c/o LOF and mild contractions through the night.  Pregnancy has been uncomplicated to date.  On arrival she was found to be ruptured and dilated to 7cm.  Due to slow progression, she was augmented with pitocin and an IUPC and received an epidural for pain management.  She progressed to complete dilation and at 11:05 AM a viable female was delivered via Vaginal, Spontaneous Delivery by Dr. Dion BodyVarnado.  Delivery complicated by 3rd degree laceration, repaired in the usual fashion.  For further information please see the delivery summary.  Her postpartum course was uncomplicated and she was discharge home in stable condition on PPD#2.  Physical Exam:  O: Temp: [97.4 F (36.3 C)-98.3 F (36.8 C)] 98.3 F (36.8 C) (10/06 0543)  Pulse Rate: [86-90] 86 (10/06 0543)  Resp: [18-20] 20 (10/06 0543)  BP: (119-121)/(68-83) 119/68 mmHg (10/06 0543)  Gen: A&Ox3, NAD  CV: RRR, no MRG  Resp: CTAB  Abdomen: soft, NT  Uterus: firm, non-tender, below umbilicus  Ext: No edema, no calf tenderness bilaterally  Discharge Diagnoses: Term Pregnancy-delivered  Discharge Information: Date: 08/12/2014 Activity: pelvic rest Diet: routine Medications: PNV, Ibuprofen and Colace Condition: stable Instructions: refer to practice specific booklet Discharge to: home Follow-up Information   Follow up with Cynthia Farmer,  Cynthia Farmer, Cynthia Farmer, Cynthia Farmer In 2 weeks.   Specialty:  Obstetrics and Gynecology   Contact information:   [address] [place] [id number] [phone]       Newborn Data: Live born female  Birth Weight: 7 lb 9.5 oz (3445 g) APGAR: 9, 9  Home with mother.  Cynthia Farmer, Cynthia Farmer, Cynthia Farmer 08/12/2014, 7:45 AM

## 2014-08-13 ENCOUNTER — Ambulatory Visit (HOSPITAL_COMMUNITY): Admit: 2014-08-13 | Payer: BC Managed Care – PPO

## 2014-09-06 ENCOUNTER — Encounter (HOSPITAL_COMMUNITY): Payer: Self-pay | Admitting: *Deleted

## 2015-02-14 ENCOUNTER — Ambulatory Visit: Payer: BLUE CROSS/BLUE SHIELD | Attending: Obstetrics & Gynecology | Admitting: Physical Therapy

## 2015-02-14 ENCOUNTER — Ambulatory Visit: Payer: Self-pay | Admitting: Physical Therapy

## 2015-02-14 ENCOUNTER — Encounter: Payer: Self-pay | Admitting: Physical Therapy

## 2015-02-14 DIAGNOSIS — N941 Dyspareunia: Secondary | ICD-10-CM | POA: Diagnosis not present

## 2015-02-14 DIAGNOSIS — N8184 Pelvic muscle wasting: Secondary | ICD-10-CM | POA: Diagnosis present

## 2015-02-14 DIAGNOSIS — M6289 Other specified disorders of muscle: Secondary | ICD-10-CM

## 2015-02-14 NOTE — Therapy (Signed)
[facility] [address], [address] [place], [place], [address] Phone: [phone]   Fax:  [phone]  Physical Therapy Evaluation  Patient Details  Name: Cynthia Farmer MRN: [id number] Date of Birth: 1986-02-02 Referring Provider:  Myna Hidalgo, DO  Encounter Date: 02/14/2015      PT End of Session - 02/14/15 1447    Visit Number 1   Date for PT Re-Evaluation 05/09/15   PT Start Time 1445   PT Stop Time 1530   PT Time Calculation (min) 45 min   Activity Tolerance Patient tolerated treatment well   Behavior During Therapy [facility] for tasks assessed/performed      Past Medical History  Diagnosis Date  . Hx of varicella   . Medical history non-contributory     History reviewed. No pertinent past surgical history.  There were no vitals filed for this visit.  Visit Diagnosis:  Pelvic floor dysfunction - Plan: PT plan of care cert/re-cert      Subjective Assessment - 02/14/15 1450    Subjective Patient is a 29 year old female who gave birth to her child 6 months ago. Patient reports intercourse is painful and vaginal exam is painful.  Patient is not having intercourse due to the pain. Patient reports there is a place in the vaginal area that took 4 months to heal from the birth. Patient reports she tore from birth at level 3 and had stitiches. Patient reports redness in an area in the vagina.    How long can you sit comfortably? No difficulty   How long can you stand comfortably? No difficulty   How long can you walk comfortably? No difficulty   Patient Stated Goals Have intercourse without pain.   Currently in Pain? Yes   Pain Score 8    Pain Location Vagina   Pain Orientation Mid  initial penetration   Pain Descriptors / Indicators Sharp;Dull;Stabbing  felt like  the vaginal opening was not big enough   Pain Type Chronic pain   Pain Onset More than a month ago   Pain Frequency Intermittent   Aggravating Factors   intercourse, vaginal exam   Pain Relieving Factors no intercourse   Effect of Pain on Daily Activities intercourse   Multiple Pain Sites No            [facility] PT Assessment - 02/14/15 0001    Assessment   Medical Diagnosis Dyspareunia   Onset Date 08/08/14   Prior Therapy None   Precautions   Precautions None   Balance Screen   Has the patient fallen in the past 6 months No   Has the patient had a decrease in activity level because of a fear of falling?  No   Is the patient reluctant to leave their home because of a fear of falling?  No   Prior Function   Level of Independence Independent with basic ADLs   Observation/Other Assessments   Focus on Therapeutic Outcomes (FOTO)  17% limitation   AROM   Lumbar Extension decreased by 50%   Strength   Right Hip Extension 4/5   Right Hip ADduction 3/5   Left Hip Extension 4/5   Left Hip ADduction 3/5                 Pelvic Floor Special Questions - 02/14/15 0001    Prior Pelvic/Prostate Exam Yes   Result Pelvic/Prostate Exam  pain   Diastasis Recti 1 finger width above umbilicus   Currently  Sexually Active Yes   Marinoff Scale pain prevents any attempts at intercourse   Urinary Leakage No   Urinary urgency No   Fecal incontinence No   Falling out feeling (prolapse) No   Skin Integrity Intact;Erthema   Perineal Body/Introitus  Elevated   External Palpation Palpable tenderness located in perineal body, posterior introitus, and right bulbocavernosus   Pelvic Floor Internal Exam Patient confirmed identification and approved physical therapist to assess pelvic floor strength and muscle integrity   Exam Type Vaginal   Palpation tenderness located in bil. obturator internist, puborectalis, ischicoccygeus  pt. apprehensive with assessment of pelvic floor   Strength fair squeeze, definite lift   Strength # of reps --  no reps due to pain                  PT Education - 02/14/15 1528    Education provided Yes    Education Details exploring your genitals, posture   Person(s) Educated Patient   Methods Explanation;Verbal cues;Handout   Comprehension Verbalized understanding          PT Short Term Goals - 02/14/15 1715    PT SHORT TERM GOAL #1   Title understand how to perform perineal soft tissue work   Time 4   Period Weeks   Status New   PT SHORT TERM GOAL #2   Title understand how to perform diaphragmatic breathing to relax the pelvic floor   Time 4   Period Weeks   Status New   PT SHORT TERM GOAL #3   Title understand what a dilator is and how to use it to open up the intoritus for intercourse   Time 4   Period Weeks   Status New           PT Long Term Goals - 02/14/15 1716    PT LONG TERM GOAL #1   Title pain with intercourse </= 75%   Time 12   Period Weeks   Status New   PT LONG TERM GOAL #2   Title marinoff score 1/3   Time 12   Period Weeks   Status New   PT LONG TERM GOAL #3   Title pelvic floor EMG 3mv or less due to relaxation of pelvic floor   Time 12   Period Weeks   Status New               Plan - 02/14/15 1712    Clinical Impression Statement Patient is a 30 year old female with pelvic pain during intercourse and vaginal exam.  Patient has tendern to palpation for perineal body and pelvic floor muscles. Patient has difficulty with elongating her pelvic floor muscles when therapist asks her.    Pt will benefit from skilled therapeutic intervention in order to improve on the following deficits Decreased coordination;Impaired flexibility;Decreased endurance;Decreased skin integrity;Increased fascial restricitons;Decreased activity tolerance;Decreased scar mobility;Increased muscle spasms;Pain;Decreased mobility;Decreased strength   Rehab Potential Good   PT Frequency 1x / week   PT Duration 12 weeks   PT Treatment/Interventions Moist Heat;Therapeutic activities;Patient/family education;Scar mobilization;Therapeutic  exercise;Biofeedback;Ultrasound;Manual techniques;Neuromuscular re-education;Cryotherapy;Electrical Stimulation;Functional mobility training   PT Next Visit Plan Soft tissue work, ultrasound, flexibility exercises, pelvic floor EMG   PT Home Exercise Plan flexibility exercises, self perineal soft tissue work   Recommended Other Services None   Consulted and Agree with Plan of Care Patient         Problem List Patient Active Problem List   Diagnosis Date Noted  . Vaginal  delivery 08/09/2014  . Third degree perineal laceration 08/09/2014    Ameka Krigbaum,PT 02/14/2015, 5:20 PM  [facility] [address], [address] [place], [place], [address] Phone: [phone]   Fax:  [phone]

## 2015-02-14 NOTE — Patient Instructions (Signed)
Gave patient handout on exploring her genitals, instructed on posture, not crossing her legs to decrease tightness in the pelvic floor.  PT verbally instructed patient on not washing her vaginal are with soap due to irritating area.  She is just to use water.

## 2015-02-24 ENCOUNTER — Encounter: Payer: Self-pay | Admitting: Physical Therapy

## 2015-02-24 ENCOUNTER — Ambulatory Visit: Payer: BLUE CROSS/BLUE SHIELD | Admitting: Physical Therapy

## 2015-02-24 DIAGNOSIS — N8184 Pelvic muscle wasting: Secondary | ICD-10-CM | POA: Diagnosis not present

## 2015-02-24 DIAGNOSIS — M6289 Other specified disorders of muscle: Secondary | ICD-10-CM

## 2015-02-24 NOTE — Patient Instructions (Addendum)
STRETCHING THE PELVIC FLOOR MUSCLES NO DILATOR  Supplies . Vaginal lubricant . Mirror (optional) . Gloves (optional) Positioning . Start in a semi-reclined position with your head propped up. Bend your knees and place your thumb or finger at the vaginal opening. Procedure . Apply a moderate amount of lubricant on the outer skin of your vagina, the labia minora.  Apply additional lubricant to your finger. Marland Kitchen. Spread the skin away from the vaginal opening. Place the end of your finger at the opening. . Do a maximum contraction of the pelvic floor muscles. Tighten the vagina and the anus maximally and relax. . When you know they are relaxed, gently and slowly insert your finger into your vagina, directing your finger slightly downward, for 2-3 inches of insertion. . Relax and stretch the 6 o'clock position . Hold each stretch for _2 min__ and repeat __1_ time with rest breaks of _1__ seconds between each stretch. . Repeat the stretching in the 4 o'clock and 8 o'clock positions. . Total time should be _6__ minutes, _1__ x per day.  Note the amount of theme your were able to achieve and your tolerance to your finger in your vagina. . Once you have accomplished the techniques you may try them in standing with one foot resting on the tub, or in other positions.  This is a good stretch to do in the shower if you don't need to use lubricant.   Purchase the [employer] Dilator at [email or web address]  Piriformis Stretch, Sitting   Sit, one ankle on opposite knee, same-side hand on crossed knee. Push down on knee, keeping spine straight. Lean torso forward, with flat back, until tension is felt in hamstrings and gluteals of crossed-leg side. Hold 30___ seconds.  Repeat _2__ times per session. Do _1__ sessions per day. Both sides.  Copyright  VHI. All rights reserved.  Butterfly, Sitting With Flexion   Grasp feet with hands and bend from hips. Gently pull forward, elbows pushing outward. Hold _30__  seconds. Repeat _2__ times per session. Do _1__ sessions per day.  Copyright  VHI. All rights reserved.   Patient able to return demonstration with above exercise and soft tissue work to the perineum correctly

## 2015-02-24 NOTE — Therapy (Signed)
[facility] [address], [address] [place], [place], [address] Phone: [phone]   Fax:  [phone]  Physical Therapy Treatment  Patient Details  Name: Cynthia Farmer MRN: [id number] Date of Birth: 02-25-1986 Referring Provider:  Myna Hidalgozan, Jennifer, DO  Encounter Date: 02/24/2015      PT End of Session - 02/24/15 1707    Visit Number 2   Date for PT Re-Evaluation 05/09/15   PT Start Time 1530   PT Stop Time 1615   PT Time Calculation (min) 45 min   Activity Tolerance Patient tolerated treatment well   Behavior During Therapy [facility] for tasks assessed/performed      Past Medical History  Diagnosis Date  . Hx of varicella   . Medical history non-contributory     History reviewed. No pertinent past surgical history.  There were no vitals filed for this visit.  Visit Diagnosis:  Pelvic floor dysfunction      Subjective Assessment - 02/24/15 1543    Subjective No changes. Just started.    How long can you sit comfortably? No difficulty   How long can you stand comfortably? No difficulty   How long can you walk comfortably? No difficulty   Patient Stated Goals Have intercourse without pain.   Currently in Pain? Yes   Pain Score 8    Pain Location Vagina   Pain Orientation Mid   Pain Descriptors / Indicators Dull;Stabbing;Sharp   Pain Type Chronic pain   Pain Onset More than a month ago   Pain Frequency Intermittent   Aggravating Factors  intercourse, vaginal exam   Pain Relieving Factors No intercourse   Multiple Pain Sites No                         [facility] Adult PT Treatment/Exercise - 02/24/15 0001    Manual Therapy   Manual Therapy Internal Pelvic Floor   Internal Pelvic Floor Patient confirmed identification and approved physical therapist to perform scar massage to the perineal body internally                PT Education - 02/24/15 1707    Education provided Yes   Education  Details perineal soft tissue work, butterfly stretch, piriformis stretch   Person(s) Educated Patient   Methods Explanation;Demonstration;Tactile cues;Verbal cues;Handout   Comprehension Returned demonstration;Verbalized understanding          PT Short Term Goals - 02/24/15 1549    PT SHORT TERM GOAL #1   Title understand how to perform perineal soft tissue work   Time 4   Period Weeks   Status New  just started to learn   PT SHORT TERM GOAL #2   Title understand how to perform diaphragmatic breathing to relax the pelvic floor   Time 4   Period Weeks           PT Long Term Goals - 02/14/15 1716    PT LONG TERM GOAL #1   Title pain with intercourse </= 75%   Time 12   Period Weeks   Status New   PT LONG TERM GOAL #2   Title marinoff score 1/3   Time 12   Period Weeks   Status New   PT LONG TERM GOAL #3   Title pelvic floor EMG 3mv or less due to relaxation of pelvic floor   Time 12   Period Weeks   Status New  Plan - 02/24/15 1708    Clinical Impression Statement Patient is a 29 year old female with pelvic pain during intercourse and vaginal exam.  Patient had stitches after the birth of her child causing a scar on the preneal area with increased sensitivity.  Patient does not tolerate touch to the perineum and pelvic floor muscles.  After soft tissue work to the perineum the scar was softer with increased mobility.  Patient will purchase a vaginal dilator to use for soft tissue work and expand the vaginal canal.    Pt will benefit from skilled therapeutic intervention in order to improve on the following deficits Decreased coordination;Impaired flexibility;Decreased endurance;Decreased skin integrity;Increased fascial restricitons;Decreased activity tolerance;Decreased scar mobility;Increased muscle spasms;Pain;Decreased mobility;Decreased strength   Rehab Potential Good   PT Frequency 1x / week   PT Duration 12 weeks   PT Treatment/Interventions  Moist Heat;Therapeutic activities;Patient/family education;Scar mobilization;Therapeutic exercise;Biofeedback;Ultrasound;Manual techniques;Neuromuscular re-education;Cryotherapy;Electrical Stimulation;Functional mobility training   PT Next Visit Plan Soft tissue work, ultrasound, flexibility exercises, pelvic floor EMG   PT Home Exercise Plan flexibility exercises, self perineal soft tissue work   Consulted and Agree with Plan of Care Patient        Problem List Patient Active Problem List   Diagnosis Date Noted  . Vaginal delivery 08/09/2014  . Third degree perineal laceration 08/09/2014   Haydan Wedig,PT 02/24/2015, 5:12 PM  [facility] [address], [address] [place], [place], [address] Phone: [phone]   Fax:  [phone]

## 2015-03-01 ENCOUNTER — Encounter: Payer: Self-pay | Admitting: Physical Therapy

## 2015-03-01 ENCOUNTER — Ambulatory Visit: Payer: BLUE CROSS/BLUE SHIELD | Admitting: Physical Therapy

## 2015-03-01 DIAGNOSIS — M6289 Other specified disorders of muscle: Secondary | ICD-10-CM

## 2015-03-01 DIAGNOSIS — N8184 Pelvic muscle wasting: Secondary | ICD-10-CM | POA: Diagnosis not present

## 2015-03-01 NOTE — Therapy (Signed)
[facility] [address] [place], [place], [address] Phone: [phone]   Fax:  [phone]  Physical Therapy Treatment  Patient Details  Name: Cynthia Farmer MRN: [id number] Date of Birth: 11-16-1985 Referring Provider:  Myna Hidalgo, DO  Encounter Date: 03/01/2015      PT End of Session - 03/01/15 1615    Visit Number 3   Date for PT Re-Evaluation 05/09/15   PT Start Time 1530   PT Stop Time 1615   PT Time Calculation (min) 45 min   Activity Tolerance Patient tolerated treatment well   Behavior During Therapy [facility] for tasks assessed/performed      Past Medical History  Diagnosis Date  . Hx of varicella   . Medical history non-contributory     History reviewed. No pertinent past surgical history.  There were no vitals filed for this visit.  Visit Diagnosis:  Pelvic floor dysfunction                    Pelvic Floor Special Questions - 03/01/15 0001    Biofeedback while connected to the pelvic floor EMG worked on downtraining pelvic floor to lower the resting level, then used a q-tip, then used therapist gloved finger   Biofeedback sensor type Surface   Biofeedback Activity Other  downtraining of the vaginal area           [facility] Adult PT Treatment/Exercise - 03/01/15 0001    Exercises   Exercises --  diaphragmatic breathing for relaxation   Manual Therapy   Manual Therapy Myofascial release;Internal Pelvic Floor   Myofascial Release to left obturator and levator ani muscles internally   Internal Pelvic Floor Patient confirms idnetification and approved physical therapsit to perform soft tissue work to the pelvic floor while she is connected to the Pelvic floor EMG to work on relaxation exericses.                  PT Education - 03/01/15 1614    Education provided Yes   Education Details how to use diaphragmatic breathing with foreplay and self soft tissue work to the  perineum.  When having intercourse she is to be in the controlled position to monitor the pain.    Person(s) Educated Patient   Methods Explanation;Demonstration;Tactile cues;Verbal cues   Comprehension Returned demonstration;Verbalized understanding          PT Short Term Goals - 03/01/15 1531    PT SHORT TERM GOAL #1   Title understand how to perform perineal soft tissue work   Time 4   Period Weeks   Status New  not comfortable performing   PT SHORT TERM GOAL #2   Title understand how to perform diaphragmatic breathing to relax the pelvic floor   Time 4   Period Weeks   Status Achieved   PT SHORT TERM GOAL #3   Title understand what a dilator is and how to use it to open up the intoritus for intercourse   Time 4   Period Weeks   Status New  just ordered the dilator           PT Long Term Goals - 03/01/15 1622    PT LONG TERM GOAL #3   Title pelvic floor EMG 3mv or less due to relaxation of pelvic floor   Time 12   Period Weeks   Status Achieved               Plan -  03/01/15 1615    Clinical Impression Statement Patient is a 29 year old female with painful intercourse.  Patient pelvic floor resting level will increase to 10 uv when a q-tip is by the pelvic floor. After patient uses diaphragmatic breathing she is able to reduce resting tone to 1-2uv.  Physical therapist would use her finger in the perineal area while patient practiced diaphragmatic breathing to downtrain the pelvic floor  so she can try this during intercourse or with self softtissue work.    Pt will benefit from skilled therapeutic intervention in order to improve on the following deficits Decreased coordination;Impaired flexibility;Decreased endurance;Decreased skin integrity;Increased fascial restricitons;Decreased activity tolerance;Decreased scar mobility;Increased muscle spasms;Pain;Decreased mobility;Decreased strength   Rehab Potential Good   PT Frequency 1x / week   PT Duration 12 weeks    PT Treatment/Interventions Moist Heat;Therapeutic activities;Patient/family education;Scar mobilization;Therapeutic exercise;Biofeedback;Ultrasound;Manual techniques;Neuromuscular re-education;Cryotherapy;Electrical Stimulation;Functional mobility training   PT Next Visit Plan Downtraining on pelvic floor, ultrasound, soft tissue work   PT Home Exercise Plan see patient in 2 weeks .Continue with downtraining, see if the guided meditation helped.    Recommended Other Services None   Consulted and Agree with Plan of Care Patient        Problem List Patient Active Problem List   Diagnosis Date Noted  . Vaginal delivery 08/09/2014  . Third degree perineal laceration 08/09/2014    GRAY,CHERYL,PT 03/01/2015, 4:22 PM  [facility] [address], [address] [place], [place], [address] Phone: [phone]   Fax:  [phone]

## 2015-03-08 ENCOUNTER — Encounter: Payer: BLUE CROSS/BLUE SHIELD | Admitting: Physical Therapy

## 2015-03-15 ENCOUNTER — Encounter: Payer: Self-pay | Admitting: Physical Therapy

## 2015-03-15 ENCOUNTER — Ambulatory Visit: Payer: BLUE CROSS/BLUE SHIELD | Attending: Obstetrics & Gynecology | Admitting: Physical Therapy

## 2015-03-15 DIAGNOSIS — N8184 Pelvic muscle wasting: Secondary | ICD-10-CM | POA: Insufficient documentation

## 2015-03-15 DIAGNOSIS — N941 Dyspareunia: Secondary | ICD-10-CM | POA: Diagnosis not present

## 2015-03-15 DIAGNOSIS — M6289 Other specified disorders of muscle: Secondary | ICD-10-CM

## 2015-03-15 NOTE — Patient Instructions (Signed)
Ball Roll (Prone)   Lie with abdomen on ball in a squatting position. Squeeze pelvic floor and hold. Straighten legs partially and roll forward. Repeat 15___ times. Do _1__ times a day.   Copyright  VHI. All rights reserved.  Leg Rock (Supine)   Lie flat with calves supported by ball.  Rock ball to one side, then the other. Relax. Repeat __10_ times. Do _1__ times a day.    Copyright  VHI. All rights reserved.  Leg Pull (Supine)   Lie flat with calves supported by ball.  Pull ball in, and let knees go outward, then push away. Relax. Repeat 10___ times. Do __1_ times a day. Advanced: Hold squeeze through all repetitions.    Copyright  VHI. All rights reserved.  Diagonal (Sitting)   Sit on ball. . Guiding with legs, move ball diagonally to left side. Relax. Repeat _10__ times. Do _1__ times a day. Alternate: Form a "V" by moving diagonal to one side then to other. Advanced:    Copyright  VHI. All rights reserved.  Kneeling Yoga   Kneel, sitting on heels. Hold onto heels and round torso, hands in front  Hold __30_ seconds. Then go to one side hold 30 seconds, then go to other side 30 seconds Repeat _1__ times per session. Do __1_ sessions per day.  Copyright  VHI. All rights reserved.  Patient able to return demonstration correctly with above exercises.

## 2015-03-15 NOTE — Therapy (Signed)
[facility] [address], [address] [place], [place], [address] Phone: [phone]   Fax:  [phone]  Physical Therapy Treatment  Patient Details  Name: Cynthia Farmer MRN: [id number] Date of Birth: 12/13/1985 Referring Provider:  Myna Hidalgozan, Jennifer, DO  Encounter Date: 03/15/2015      PT End of Session - 03/15/15 1605    Visit Number 4   Date for PT Re-Evaluation 05/09/15   PT Start Time 1530   PT Stop Time 1608   PT Time Calculation (min) 38 min   Activity Tolerance Patient tolerated treatment well   Behavior [facility] Therapy [facility] for tasks assessed/performed      Past Medical History  Diagnosis Date  . Hx of varicella   . Medical history non-contributory     History reviewed. No pertinent past surgical history.  There were no vitals filed for this visit.  Visit Diagnosis:  Pelvic floor dysfunction      Subjective Assessment - 03/15/15 1534    Subjective I had a second opinion and they said one area has not healed.  The internal exam was so much easier and no pain.    Patient is accompained by: Family member   How long can you sit comfortably? No difficulty   How long can you stand comfortably? No difficulty   How long can you walk comfortably? No difficulty   Patient Stated Goals Have intercourse without pain.   Currently in Pain? No/denies   Multiple Pain Sites No                         [facility] Adult PT Treatment/Exercise - 03/15/15 0001    Exercises   Exercises --  butterfly stretch   Lumbar Exercises: Stretches   Quadruped Mid Back Stretch 2 reps;30 seconds  prayer stretch forward, side to side   Knee/Hip Exercises: Stretches   Piriformis Stretch 2 reps;30 seconds  bil.                 PT Education - 03/15/15 1604    Education provided Yes   Education Details flexibility exercises with physioball, prayer stretch   Person(s) Educated Patient   Methods  Explanation;Demonstration;Tactile cues;Verbal cues;Handout   Comprehension Returned demonstration;Verbalized understanding          PT Short Term Goals - 03/15/15 1609    PT SHORT TERM GOAL #1   Title understand how to perform perineal soft tissue work   Period Weeks   Status New  not to do due to opening not healed   PT SHORT TERM GOAL #3   Title understand what a dilator is and how to use it to open up the intoritus for intercourse   Time 4   Period Weeks   Status Achieved           PT Long Term Goals - 03/15/15 1609    PT LONG TERM GOAL #1   Title pain with intercourse </= 75%   Time 12   Period Weeks   Status New   PT LONG TERM GOAL #2   Title marinoff score 1/3   Time 12   Period Weeks   Status New               Plan - 03/15/15 1605    Clinical Impression Statement Patient is a 29 year old female with pelvic pain with intercourse and vaginal exams.  Patient reports she had a second opinion and the  physician feels posterior to the preineal body had not healed fully causing pain.  Patient  was able to have a vaginal exam without pain due to her having time to perform relaxation exercises to relax her pelvic floor.  Patient has not used the dilator yet.  PT advised patient to not use the dilator due to her not wanting to open the area that is not healed.  Patient will be placed on hold to see if she is having the procedure and ee therapist afterwards if needed.    Pt will benefit from skilled therapeutic intervention in order to improve on the following deficits Decreased coordination;Impaired flexibility;Decreased endurance;Decreased skin integrity;Increased fascial restricitons;Decreased activity tolerance;Decreased scar mobility;Increased muscle spasms;Pain;Decreased mobility;Decreased strength   Rehab Potential Good   Clinical Impairments Affecting Rehab Potential None   PT Frequency 1x / week   PT Duration 12 weeks   PT Treatment/Interventions Moist  Heat;Therapeutic activities;Patient/family education;Scar mobilization;Therapeutic exercise;Biofeedback;Ultrasound;Manual techniques;Neuromuscular re-education;Cryotherapy;Electrical Stimulation;Functional mobility training   PT Next Visit Plan patient on hold till after her procedure   PT Home Exercise Plan on hold   Consulted and Agree with Plan of Care Patient        Problem List Patient Active Problem List   Diagnosis Date Noted  . Vaginal delivery 08/09/2014  . Third degree perineal laceration 08/09/2014    Eveleen Mcnear,PT 03/15/2015, 4:10 PM  [facility] [address], [address] [place], [place], [address] Phone: [phone]   Fax:  [phone]

## 2015-03-22 ENCOUNTER — Encounter: Payer: BLUE CROSS/BLUE SHIELD | Admitting: Physical Therapy

## 2015-03-29 ENCOUNTER — Ambulatory Visit: Payer: BLUE CROSS/BLUE SHIELD | Admitting: Physical Therapy

## 2015-04-12 NOTE — Therapy (Signed)
[facility] [address], [place], [place], [address] Phone: [phone]   Fax:  [phone]  Patient Details  Name: Cynthia Farmer MRN: [id number] Date of Birth: [birth year] Referring Provider:  Janyth Pupa, DO  Encounter Date: 03/15/2015  PHYSICAL THERAPY DISCHARGE SUMMARY  Visits from Start of Care: 4  Current functional level related to goals / functional outcomes: Patient has met all of her STG's.  Patient has a dilator and understands how to use it.  Patient able to have intercourse but has pain in posterior vaginal wall. Patient is able to relax her pelvic floor to 3 uv with pelvic EMG.    Remaining deficits: Discomfort in the posterior vaginal wall with intercourse. Physical therapist spoke to patient on 04/12/2015.  She is seeing the physician to consult about having a procedure to repair the posterior vaginal area that is not healed yet. Patient is being discharged due to having a procedure to the vaginal area.   Education / Equipment: HEP  Plan: Patient agrees to discharge.  Patient goals were partially met. Patient is being discharged due to the patient's request.  ?????     Kymari Lollis,PT 04/12/2015, 12:55 PM  [facility] [address], [place], [place], [address] Phone: [phone]   Fax:  [phone]

## 2015-06-21 ENCOUNTER — Ambulatory Visit
Admission: RE | Admit: 2015-06-21 | Discharge: 2015-06-21 | Disposition: A | Discharge status: Home / Self Care | Payer: BLUE CROSS/BLUE SHIELD | Source: Ambulatory Visit | Attending: Medical | Admitting: Medical

## 2015-06-21 ENCOUNTER — Other Ambulatory Visit: Payer: Self-pay | Admitting: Medical

## 2015-06-21 DIAGNOSIS — M79645 Pain in left finger(s): Secondary | ICD-10-CM

## 2015-06-24 DIAGNOSIS — M65839 Other synovitis and tenosynovitis, unspecified forearm: Secondary | ICD-10-CM | POA: Insufficient documentation

## 2015-07-04 ENCOUNTER — Ambulatory Visit: Payer: BLUE CROSS/BLUE SHIELD | Admitting: Occupational Therapy

## 2015-08-05 ENCOUNTER — Ambulatory Visit: Payer: BLUE CROSS/BLUE SHIELD | Attending: Surgery | Admitting: Occupational Therapy

## 2015-08-05 DIAGNOSIS — M25642 Stiffness of left hand, not elsewhere classified: Secondary | ICD-10-CM | POA: Diagnosis present

## 2015-08-05 DIAGNOSIS — M79642 Pain in left hand: Secondary | ICD-10-CM

## 2015-08-05 DIAGNOSIS — M25632 Stiffness of left wrist, not elsewhere classified: Secondary | ICD-10-CM | POA: Diagnosis present

## 2015-08-05 DIAGNOSIS — M25532 Pain in left wrist: Secondary | ICD-10-CM | POA: Diagnosis present

## 2015-08-05 DIAGNOSIS — M6281 Muscle weakness (generalized): Secondary | ICD-10-CM

## 2015-08-05 NOTE — Therapy (Signed)
[facility] [address], [place], [address] Phone: [phone]   Fax:  [phone]  Occupational Therapy Treatment  Patient Details  Name: Cynthia Farmer MRN: [id number] Date of Birth: 04-Jan-1986 Referring Provider:  Christena Flake, MD  Encounter Date: 08/05/2015      OT End of Session - 08/05/15 1407    Visit Number 1   Number of Visits 12   Date for OT Re-Evaluation 09/16/15   OT Start Time 0921   OT Stop Time 1016   OT Time Calculation (min) 55 min   Activity Tolerance Patient tolerated treatment well   Behavior During Therapy [facility] for tasks assessed/performed      Past Medical History  Diagnosis Date  . Hx of varicella   . Medical history non-contributory     No past surgical history on file.  There were no vitals filed for this visit.  Visit Diagnosis:  Pain of left hand - Plan: Ot plan of care cert/re-cert  Left wrist pain - Plan: Ot plan of care cert/re-cert  Stiffness of finger joint, left - Plan: Ot plan of care cert/re-cert  Wrist stiffness, left - Plan: Ot plan of care cert/re-cert  Muscle weakness - Plan: Ot plan of care cert/re-cert      Subjective Assessment - 08/05/15 1132    Subjective  My pain in my thumb started 05/12/15 the morning - I remember gettting up - don't no if I slept on it wrong - but we were on trip and remember lifting my daughter a lot out of car seat  reaching around with my L    Patient Stated Goals I want to use my hand like before and the pain better    Currently in Pain? Yes   Pain Score 8    Pain Location Wrist   Pain Orientation Left   Pain Descriptors / Indicators Stabbing   Pain Onset More than a month ago   Pain Frequency Occasional   Aggravating Factors  moving hand and wrist    Pain Relieving Factors no using it             [facility] OT Assessment - 08/05/15 0001    Assessment   Diagnosis DeQuervain L    Onset Date 05/12/15   Assessment  Pt present with L extensor tenosynovitis L wrist - pt seen Dr Joice Lofts in Aug 19th - fitted with thumb spica but did not come to therapy until this date - did not use ibruprofen    Prior Function   Level of Independence Independent   Vocation Full time employment   Leisure Pt work full time at [employer] , on Animator mostly , 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 - pt  has 31 yr old little girl that she need to pick up, still doing breast feeding, and household stuff-    AROM   Right Wrist Extension 60 Degrees   Right Wrist Flexion 90 Degrees   Right Wrist Radial Deviation 15 Degrees   Right Wrist Ulnar Deviation 43 Degrees   Left Wrist Extension 30 Degrees   Left Wrist Flexion 50 Degrees   Left Wrist Radial Deviation 8 Degrees   Left Wrist Ulnar Deviation 23 Degrees   Strength   Right Hand Grip (lbs) 55   Right Hand Lateral Pinch 13 lbs   Right Hand 3 Point Pinch 10 lbs   Left Hand Grip (lbs) 30   Left Hand Lateral Pinch 10 lbs  Left Hand 3 Point Pinch 9 lbs                  OT Treatments/Exercises (OP) - 08/05/15 0001    LUE Contrast Bath   Time 11 minutes   Comments prior to review of HEP - decrease pain and inflammation - pt ed on doing at home                 OT Education - 08/05/15 1406    Education provided Yes   Education Details Modailities, modifications in act with daughter and other daily tasks   Person(s) Educated Patient   Methods Explanation;Demonstration;Tactile cues;Handout;Verbal cues   Comprehension Verbal cues required;Returned demonstration;Verbalized understanding          OT Short Term Goals - 08/05/15 1415    OT SHORT TERM GOAL #1   Title Pain on PRWHE improve by at least 10-15 points    Baseline pain on PRWHE 22/50   Time 4   Period Weeks   Status New   OT SHORT TERM GOAL #2   Title Pt to be ind in HEP  to increase AROM at wrist and thumb to [facility] when painfree   Baseline  Pt pain at the worse 8/10 and pain with wrist flexion and thumb  flexion and extention   Time 5   Period Weeks   Status New   OT SHORT TERM GOAL #3   Title Pt verbalize at  least 4 modifications  and avoidance of provoking postures or activities in daily activities at home, with daughter and work    Baseline very little knowlege - work full time and has 52 year old daughter   Time 2   Period Weeks   Status New           OT Long Term Goals - 08/05/15 1420    OT LONG TERM GOAL #1   Title Function on PRWHE improve with at least  10 points to return to use hand without brace pain free   Baseline Function on PRHWE 18/50 at eval    Time 6   Period Weeks   Status New   OT LONG TERM GOAL #2   Title Grip strength improve with 10 lbs to return to prior level of function    Baseline Grip strength R55; L 30 lbs ; lat grip R 13, L 10     Time 6   Period Weeks   Status New               Plan - 08/05/15 1410    Clinical Impression Statement Pt present with L wrist extensor tenosynovitis  since early July per pt - she did not present for evaluation at therapy this week - was refer more than month ago - pt positive for [facility] test , and tenderness/pain over distal radius head - pt in prefab thumb spica  since middle Aug - show decrease ROM at wrist and thumb in all planes - did not try ibuprofen  - pt  lmited in her functional use  by pain , decreaes ROM and strength - pt at he worse 8/10  - recommend  ionto with dexamethasone  did get verbal order this date    Pt will benefit from skilled therapeutic intervention in order to improve on the following deficits (Retired) Pain;Impaired flexibility;Decreased strength;Decreased range of motion;Decreased coordination;Decreased activity tolerance   Rehab Potential Good   OT Frequency 2x / week   OT Duration 6  weeks   OT Treatment/Interventions Self-care/ADL training;Cryotherapy;Moist Heat;Contrast Bath;Fluidtherapy;Iontophoresis;Therapeutic exercise;Manual Therapy;Passive range of  motion;Splinting;Patient/family education   Plan Assess pain - do ionto with dexamethazone - splint wearing    OT Home Exercise Plan see pt instruction   Consulted and Agree with Plan of Care Patient        Problem List Patient Active Problem List   Diagnosis Date Noted  . Vaginal delivery 08/09/2014  . Third degree perineal laceration 08/09/2014    Oletta Cohn OTR/L, CLT 08/05/2015, 2:28 PM  [facility] PHYSICAL AND SPORTS MEDICINE [address], [place], [address] Phone: [phone]   Fax:  [phone]

## 2015-08-05 NOTE — Patient Instructions (Signed)
Pt to do contrast to decrease pain - about 2 x day - 3 min heat, 1 min ice x 2 or 3  AROM thumb static with had waving away from thumb 6-8 pain free Prayer stretch for wrist extention in shower pain free 6-8 x  Could not tolerate wrist flexion and thumb was pain free   Ed on modifications with handle daughter

## 2015-08-08 ENCOUNTER — Ambulatory Visit: Payer: BLUE CROSS/BLUE SHIELD | Attending: Surgery | Admitting: Occupational Therapy

## 2015-08-08 DIAGNOSIS — M25532 Pain in left wrist: Secondary | ICD-10-CM

## 2015-08-08 DIAGNOSIS — M79631 Pain in right forearm: Secondary | ICD-10-CM | POA: Insufficient documentation

## 2015-08-08 DIAGNOSIS — M6281 Muscle weakness (generalized): Secondary | ICD-10-CM | POA: Diagnosis present

## 2015-08-08 DIAGNOSIS — R279 Unspecified lack of coordination: Secondary | ICD-10-CM | POA: Insufficient documentation

## 2015-08-08 DIAGNOSIS — M25632 Stiffness of left wrist, not elsewhere classified: Secondary | ICD-10-CM | POA: Diagnosis present

## 2015-08-08 DIAGNOSIS — M629 Disorder of muscle, unspecified: Secondary | ICD-10-CM | POA: Diagnosis present

## 2015-08-08 DIAGNOSIS — M6289 Other specified disorders of muscle: Secondary | ICD-10-CM

## 2015-08-08 DIAGNOSIS — N8184 Pelvic muscle wasting: Secondary | ICD-10-CM | POA: Diagnosis present

## 2015-08-08 DIAGNOSIS — M79642 Pain in left hand: Secondary | ICD-10-CM

## 2015-08-08 DIAGNOSIS — M25642 Stiffness of left hand, not elsewhere classified: Secondary | ICD-10-CM

## 2015-08-08 NOTE — Therapy (Signed)
[facility] [place] [facility] [address], [place], [address] Phone: [phone]   Fax:  [phone]  Occupational Therapy Treatment  Patient Details  Name: Cynthia Farmer MRN: [id number] Date of Birth: [birth year] Referring Provider:  Christena Flake, MD  Encounter Date: 08/08/2015      OT End of Session - 08/08/15 1508    Visit Number 2   Number of Visits 12   Date for OT Re-Evaluation 09/16/15   OT Start Time 0921   OT Stop Time 1011   OT Time Calculation (min) 50 min   Activity Tolerance Patient tolerated treatment well   Behavior During Therapy [facility] for tasks assessed/performed      Past Medical History  Diagnosis Date  . Hx of varicella   . Medical history non-contributory     No past surgical history on file.  There were no vitals filed for this visit.  Visit Diagnosis:  Pain of left hand  Left wrist pain  Stiffness of finger joint, left  Wrist stiffness, left  Muscle weakness  Pelvic floor dysfunction      Subjective Assessment - 08/08/15 1449    Subjective  It was busy this weekend -trying to work it into my schedule - did the contrast yesterday - not good Sat - and did take ibuprofen only 1 x since last time - did wear the splint - do you know if the meds go into my breast milk - because I still breastfeed    Patient Stated Goals I want to use my hand like before and the pain better    Currently in Pain? Yes   Pain Score 3    Pain Location Wrist   Pain Orientation Left   Pain Descriptors / Indicators Aching;Stabbing                      OT Treatments/Exercises (OP) - 08/08/15 0001    Wrist Exercises   Other wrist exercises prayer stretch pain free range , and UD with thumb static - pain free range    Electrical Stimulation   Electrical Stimulation Location EPB and AP just proximal to radial head and extensor mass - had to move distal pad - could not tolerate it on  1st dorsal  compartment - or just to the side   Electrical Stimulation Action 15 min    Electrical Stimulation Parameters high volt - protocol on unit    Electrical Stimulation Goals Pain   Ultrasound   Ultrasound Location 1st dorsal compartment -   Ultrasound Parameters 3. at 0.8 intensity and 20% - attempted 50% but  felt some discomfort    Ultrasound Goals Pain;Other (Comment)                OT Education - 08/08/15 1507    Education provided Yes   Education Details modifications , home program   Person(s) Educated Patient   Methods Explanation;Demonstration;Tactile cues   Comprehension Returned demonstration;Verbal cues required;Verbalized understanding          OT Short Term Goals - 08/05/15 1415    OT SHORT TERM GOAL #1   Title Pain on PRWHE improve by at least 10-15 points    Baseline pain on PRWHE 22/50   Time 4   Period Weeks   Status New   OT SHORT TERM GOAL #2   Title Pt to be ind in HEP  to increase AROM at wrist and thumb to [facility] when  painfree   Baseline  Pt pain at the worse 8/10 and pain with wrist flexion and thumb flexion and extention   Time 5   Period Weeks   Status New   OT SHORT TERM GOAL #3   Title Pt verbalize at  least 4 modifications  and avoidance of provoking postures or activities in daily activities at home, with daughter and work    Baseline very little knowlege - work full time and has 69 year old daughter   Time 2   Period Weeks   Status New           OT Long Term Goals - 08/05/15 1420    OT LONG TERM GOAL #1   Title Function on PRWHE improve with at least  10 points to return to use hand without brace pain free   Baseline Function on PRHWE 18/50 at eval    Time 6   Period Weeks   Status New   OT LONG TERM GOAL #2   Title Grip strength improve with 10 lbs to return to prior level of function    Baseline Grip strength R55; L 30 lbs ; lat grip R 13, L 10     Time 6   Period Weeks   Status New               Plan -  08/08/15 1508    Clinical Impression Statement Pt cont to have tenderness and pain - pt did had sensory changes during [place] at 50% adn E stim over 1st dorsal compartment  - and had to adjust it some what - pt breast feeding and cannot guarantee that ioto would not go in her breast milk -    Pt will benefit from skilled therapeutic intervention in order to improve on the following deficits (Retired) Pain;Impaired flexibility;Decreased strength;Decreased range of motion;Decreased coordination;Decreased activity tolerance   Rehab Potential Good   OT Frequency 2x / week   OT Duration 6 weeks   OT Treatment/Interventions Self-care/ADL training;Cryotherapy;Moist Heat;Contrast Bath;Fluidtherapy;Iontophoresis;Therapeutic exercise;Manual Therapy;Passive range of motion;Splinting;Patient/family education   Plan assess pain and how felt after session    OT Home Exercise Plan see pt instruction   Consulted and Agree with Plan of Care Patient        Problem List Patient Active Problem List   Diagnosis Date Noted  . Vaginal delivery 08/09/2014  . Third degree perineal laceration 08/09/2014    Oletta Cohn  OTR/l,CLT  08/08/2015, 3:12 PM  [facility] PHYSICAL AND SPORTS MEDICINE [address] [place], [address] Phone: [phone]   Fax:  [phone]

## 2015-08-08 NOTE — Patient Instructions (Signed)
Cont with prefab thumb spica and contrast- pain free range use

## 2015-08-10 ENCOUNTER — Ambulatory Visit: Payer: BLUE CROSS/BLUE SHIELD | Admitting: Occupational Therapy

## 2015-08-10 DIAGNOSIS — M25632 Stiffness of left wrist, not elsewhere classified: Secondary | ICD-10-CM

## 2015-08-10 DIAGNOSIS — M25642 Stiffness of left hand, not elsewhere classified: Secondary | ICD-10-CM

## 2015-08-10 DIAGNOSIS — M79642 Pain in left hand: Secondary | ICD-10-CM | POA: Diagnosis not present

## 2015-08-10 DIAGNOSIS — M25532 Pain in left wrist: Secondary | ICD-10-CM

## 2015-08-10 NOTE — Therapy (Signed)
[place] [facility] [address] [place], [address] Phone: [phone]   Fax:  [phone]  Occupational Therapy Treatment  Patient Details  Name: Cynthia Farmer MRN: [id number] Date of Birth: 1986/05/25 Referring Provider:  Christena Flake, MD  Encounter Date: 08/10/2015      OT End of Session - 08/10/15 1422    Visit Number 3   Number of Visits 12   Date for OT Re-Evaluation 09/16/15   OT Start Time 1326   OT Stop Time 1400   OT Time Calculation (min) 34 min   Activity Tolerance Patient tolerated treatment well   Behavior During Therapy [facility] for tasks assessed/performed      Past Medical History  Diagnosis Date  . Hx of varicella   . Medical history non-contributory     No past surgical history on file.  There were no vitals filed for this visit.  Visit Diagnosis:  Pain of left hand  Left wrist pain  Stiffness of finger joint, left  Wrist stiffness, left      Subjective Assessment - 08/10/15 1412    Subjective  Monday I did  take splint off to bath my daughter and then I left it off and put some towels in the washer - and I did a movement and it hurt so bad - I was in tears - but I am taking some ibuprofen since Monday night and doing contrast - but that nerve or something do give me some issues - that splint push on my  wrist where it hurts     Patient Stated Goals I want to use my hand like before and the pain better    Currently in Pain? No/denies            [facility] OT Assessment - 08/10/15 0001    AROM   Left Wrist Extension 40 Degrees   Left Wrist Flexion 50 Degrees                  OT Treatments/Exercises (OP) - 08/10/15 0001    Wrist Exercises   Other wrist exercises ROM of wrist extention and flexion assess   Other wrist exercises Still tender  distal of radius head    Cryotherapy   Number Minutes Cryotherapy 2 Minutes   Cryotherapy Location Wrist   Type of  Cryotherapy Ice massage   Electrical Stimulation   Electrical Stimulation Location EPB and AP just proximal to radial head and extensor mass - had to move distal pad - could not tolerate it on  1st dorsal compartment - or just to the side   Electrical Stimulation Action 15 min    Electrical Stimulation Parameters protocol for hand and wrist pain  - at 3.5 current, for acute pain    Electrical Stimulation Goals Pain   Splinting   Splinting assess prefab thumb spica - thumb bar push right on her 1st dorsal compartment /distal radius - mold skin padded with and ed on not pulling straps to tight - also silicon pad provided to switch too if mold skin not enough                 OT Education - 08/10/15 1422    Education provided Yes   Education Details HEP , splint fitting and wearing    Person(s) Educated Patient   Methods Explanation;Demonstration;Tactile cues;Verbal cues   Comprehension Verbal cues required;Returned demonstration;Verbalized understanding  OT Short Term Goals - 08/05/15 1415    OT SHORT TERM GOAL #1   Title Pain on PRWHE improve by at least 10-15 points    Baseline pain on PRWHE 22/50   Time 4   Period Weeks   Status New   OT SHORT TERM GOAL #2   Title Pt to be ind in HEP  to increase AROM at wrist and thumb to [facility] when painfree   Baseline  Pt pain at the worse 8/10 and pain with wrist flexion and thumb flexion and extention   Time 5   Period Weeks   Status New   OT SHORT TERM GOAL #3   Title Pt verbalize at  least 4 modifications  and avoidance of provoking postures or activities in daily activities at home, with daughter and work    Baseline very little knowlege - work full time and has 47 year old daughter   Time 2   Period Weeks   Status New           OT Long Term Goals - 08/05/15 1420    OT LONG TERM GOAL #1   Title Function on PRWHE improve with at least  10 points to return to use hand without brace pain free   Baseline Function on  PRHWE 18/50 at eval    Time 6   Period Weeks   Status New   OT LONG TERM GOAL #2   Title Grip strength improve with 10 lbs to return to prior level of function    Baseline Grip strength R55; L 30 lbs ; lat grip R 13, L 10     Time 6   Period Weeks   Status New               Plan - 08/10/15 1423    Clinical Impression Statement Pt cont to be tender over 1st dorsal compartment and reported pain when using it for laundry Monday night without splint -  cont estim for pain and tendinitis and to add ice massage -did pad splint that was causing some discomfort at  sensory brance of radial nerve    Pt will benefit from skilled therapeutic intervention in order to improve on the following deficits (Retired) Pain;Impaired flexibility;Decreased strength;Decreased range of motion;Decreased coordination;Decreased activity tolerance   Rehab Potential Good   OT Frequency 2x / week   OT Duration 4 weeks   OT Treatment/Interventions Self-care/ADL training;Cryotherapy;Moist Heat;Contrast Bath;Fluidtherapy;Iontophoresis;Therapeutic exercise;Manual Therapy;Passive range of motion;Splinting;Patient/family education   Plan assess pain and felt after session    OT Home Exercise Plan see pt instruction   Consulted and Agree with Plan of Care Patient        Problem List Patient Active Problem List   Diagnosis Date Noted  . Vaginal delivery 08/09/2014  . Third degree perineal laceration 08/09/2014    Oletta Cohn OTR/L,CLT 08/10/2015, 2:27 PM   [facility] PH[facility] SPORTS MEDICINE [address] [place], [address] Phone: [phone]   Fax:  [phone]

## 2015-08-10 NOTE — Patient Instructions (Signed)
Same HEP but ed on donning and fastening of thumb spica And add ice cup massage few times during day over 1 st dorsal compartment

## 2015-08-15 ENCOUNTER — Ambulatory Visit: Payer: BLUE CROSS/BLUE SHIELD | Admitting: Occupational Therapy

## 2015-08-15 DIAGNOSIS — M6281 Muscle weakness (generalized): Secondary | ICD-10-CM

## 2015-08-15 DIAGNOSIS — M25632 Stiffness of left wrist, not elsewhere classified: Secondary | ICD-10-CM

## 2015-08-15 DIAGNOSIS — M79631 Pain in right forearm: Secondary | ICD-10-CM

## 2015-08-15 DIAGNOSIS — M79642 Pain in left hand: Secondary | ICD-10-CM

## 2015-08-15 DIAGNOSIS — M25532 Pain in left wrist: Secondary | ICD-10-CM

## 2015-08-15 DIAGNOSIS — M25642 Stiffness of left hand, not elsewhere classified: Secondary | ICD-10-CM

## 2015-08-15 NOTE — Patient Instructions (Signed)
THumb spica on bilateral hands and wrrist    Review again modifications for work, and taking care of child - to not flare up symptoms

## 2015-08-15 NOTE — Therapy (Signed)
[facility]TS MEDICINE [address] [place], [address] Phone: [phone]   Fax:  [phone]  Occupational Therapy Treatment  Patient Details  Name: Cynthia Farmer MRN: [id number] Date of Birth: [birth year] Referring Provider:  Christena Flake, MD  Encounter Date: 08/15/2015      OT End of Session - 08/15/15 1113    Visit Number 4   Number of Visits 12   Date for OT Re-Evaluation 09/16/15   OT Start Time 0915   OT Stop Time 0954   OT Time Calculation (min) 39 min   Activity Tolerance Patient tolerated treatment well   Behavior During Therapy [facility] for tasks assessed/performed      Past Medical History  Diagnosis Date  . Hx of varicella   . Medical history non-contributory     No past surgical history on file.  There were no vitals filed for this visit.  Visit Diagnosis:  Pain of left hand  Left wrist pain  Stiffness of finger joint, left  Wrist stiffness, left  Muscle weakness  Pain of right forearm      Subjective Assessment - 08/15/15 1106    Subjective  I took ibuprofen for 5 days until Saturday - my L one is better - I can tell difference but my L thumb /wrist now hurting since Thursday- ache the whole time -  I try and change the way I do things    Patient Stated Goals I want to use my hand like before and the pain better    Currently in Pain? Yes   Pain Score 5    Pain Location Wrist   Pain Orientation Right   Pain Descriptors / Indicators Aching   Pain Type Acute pain   Pain Onset In the past 7 days                      OT Treatments/Exercises (OP) - 08/15/15 0001    ADLs   ADL Comments Ed and review with pt again modifications with using hands/wrist in taking care of her 31 year old and at work/house work activities    Programme researcher, broadcasting/film/video Location R and L EPB and AP just proximal to radial head and extensor mass - had to move distal pad -  could not tolerate it on  1st dorsal compartment - or just to the side   Electrical Stimulation Action   Electrical Stimulation Parameters protocol for hand and wrist/acut pain    Electrical Stimulation Goals Pain   Splinting   Splinting Tenderness on L better with padding inside of thumb spica after last session - pt positive test for R Finkelstein - tenderness over distal Radius  - splinted prefab thumb spica on the R                 OT Education - 08/15/15 1112    Education provided Yes   Education Details HEP, splint wearing , modifications    Person(s) Educated Patient   Methods Explanation;Demonstration;Tactile cues;Verbal cues   Comprehension Verbal cues required;Returned demonstration;Verbalized understanding          OT Short Term Goals - 08/05/15 1415    OT SHORT TERM GOAL #1   Title Pain on PRWHE improve by at least 10-15 points    Baseline pain on PRWHE 22/50   Time 4   Period Weeks   Status New   OT SHORT TERM GOAL #2  Title Pt to be ind in HEP  to increase AROM at wrist and thumb to [facility] when painfree   Baseline  Pt pain at the worse 8/10 and pain with wrist flexion and thumb flexion and extention   Time 5   Period Weeks   Status New   OT SHORT TERM GOAL #3   Title Pt verbalize at  least 4 modifications  and avoidance of provoking postures or activities in daily activities at home, with daughter and work    Baseline very little knowlege - work full time and has 66 year old daughter   Time 2   Period Weeks   Status New           OT Long Term Goals - 08/05/15 1420    OT LONG TERM GOAL #1   Title Function on PRWHE improve with at least  10 points to return to use hand without brace pain free   Baseline Function on PRHWE 18/50 at eval    Time 6   Period Weeks   Status New   OT LONG TERM GOAL #2   Title Grip strength improve with 10 lbs to return to prior level of function    Baseline Grip strength R55; L 30 lbs ; lat grip R 13, L 10      Time 6   Period Weeks   Status New               Plan - 08/15/15 1114    Clinical Impression Statement Pt report pain is better on the L thumb and wrist - did padd splint inside last time too - pt positive test this am for DeQuervains on R - pt was fitted with prefab thumb spica and then reviewed with pt modifications of taking care of child , working and telephone texting use - pt swipe using R thumb  alot per pt -  Estim done on both and treated - pt cannot do ionto because of breast feeding  - cont  to decrease pain    Pt will benefit from skilled therapeutic intervention in order to improve on the following deficits (Retired) Pain;Impaired flexibility;Decreased strength;Decreased range of motion;Decreased coordination;Decreased activity tolerance   Rehab Potential Good   OT Frequency 2x / week   OT Duration 4 weeks   OT Treatment/Interventions Self-care/ADL training;Cryotherapy;Moist Heat;Contrast Bath;Fluidtherapy;Iontophoresis;Therapeutic exercise;Manual Therapy;Passive range of motion;Splinting;Patient/family education   Plan assess pain , modifications and splint use    OT Home Exercise Plan see pt instruction   Consulted and Agree with Plan of Care Patient        Problem List Patient Active Problem List   Diagnosis Date Noted  . Vaginal delivery 08/09/2014  . Third degree perineal laceration 08/09/2014    Oletta Cohn OTR/L,CLT 08/15/2015, 11:19 AM  [facility] PHYSICAL AND SPORTS MEDICINE [address], [place], [address] Phone: [phone]   Fax:  [phone]

## 2015-08-19 ENCOUNTER — Ambulatory Visit: Payer: BLUE CROSS/BLUE SHIELD | Admitting: Occupational Therapy

## 2015-08-19 DIAGNOSIS — M79642 Pain in left hand: Secondary | ICD-10-CM

## 2015-08-19 DIAGNOSIS — M79631 Pain in right forearm: Secondary | ICD-10-CM

## 2015-08-19 DIAGNOSIS — M6281 Muscle weakness (generalized): Secondary | ICD-10-CM

## 2015-08-19 DIAGNOSIS — M25532 Pain in left wrist: Secondary | ICD-10-CM

## 2015-08-19 DIAGNOSIS — M25642 Stiffness of left hand, not elsewhere classified: Secondary | ICD-10-CM

## 2015-08-19 DIAGNOSIS — M25632 Stiffness of left wrist, not elsewhere classified: Secondary | ICD-10-CM

## 2015-08-19 NOTE — Therapy (Signed)
[facility] PH[facility]ICINE [address] [place], [place], [address] Phone: [phone]   Fax:  [phone]  Occupational Therapy Treatment  Patient Details  Name: Cynthia HarpinKristin Farmer MRN: [id number] Date of Birth: [birth year] No Data Recorded  Encounter Date: 08/19/2015      OT End of Session - 08/19/15 1022    Visit Number 5   Number of Visits 12   Date for OT Re-Evaluation 09/16/15   OT Start Time 0914   OT Stop Time 1001   OT Time Calculation (min) 47 min   Activity Tolerance Patient tolerated treatment well   Behavior During Therapy [facility] for tasks assessed/performed      Past Medical History  Diagnosis Date  . Hx of varicella   . Medical history non-contributory     No past surgical history on file.  There were no vitals filed for this visit.  Visit Diagnosis:  Pain of left hand  Left wrist pain  Wrist stiffness, left  Stiffness of finger joint, left  Muscle weakness  Pain of right forearm      Subjective Assessment - 08/19/15 0923    Subjective  Pain is about 5/10 on the R - but it is harder with my dominant hand in the splint - more sore around the forearm /wrist - the L one did not really hurt this week  - no sharp shooting pain - moving the thumb just sore - more from stiffness    Patient Stated Goals I want to use my hand like before and the pain better    Currently in Pain? Yes   Pain Score 5    Pain Location Wrist   Pain Orientation Left   Pain Descriptors / Indicators Aching                      OT Treatments/Exercises (OP) - 08/19/15 0001    Hand Exercises   Other Hand Exercises Opposition to all digits and sliding down 5th digiit , UD of wrist with thumb static - add to HEP    Electrical Stimulation   Electrical Stimulation Location R EPB and extensor mass   Electrical Stimulation Action 10   Electrical Stimulation Parameters protocol acute pain,   Electrical Stimulation Goals  Pain   LUE Fluidotherapy   Number Minutes Fluidotherapy 10 Minutes   LUE Fluidotherapy Location Hand;Wrist   Comments AROM of thumb and wrist to decrease pain and decrease stiffness- had no pain at thumb with AROM    Splinting   Splinting Pt fitted with bilateral neoprene short thumb and wrist wrap - 2hrs on and off with thum spica to decrease stiffness at thumb    Manual Therapy   Manual therapy comments Tender at distal radius on R - less on L                 OT Education - 08/19/15 1022    Education provided Yes   Education Details HEP   Person(s) Educated Patient   Methods Explanation;Demonstration;Tactile cues;Verbal cues   Comprehension Verbalized understanding;Returned demonstration;Verbal cues required          OT Short Term Goals - 08/19/15 1026    OT SHORT TERM GOAL #1   Title Pain on PRWHE improve by at least 10-15 points    Baseline pain on PRWHE 22/50 at eval   Time 4   Period Weeks   Status On-going   OT SHORT TERM GOAL #2  Title Pt to be ind in HEP  to increase AROM at wrist and thumb to [facility] when painfree   Baseline  Pt pain at the worse 8/10 and pain with wrist flexion and thumb flexion and extention at eval - pain this date 5/10   Time 4   Period Weeks   Status On-going   OT SHORT TERM GOAL #3   Title Pt verbalize at  least 4 modifications  and avoidance of provoking postures or activities in daily activities at home, with daughter and work    Baseline  work full time and has 36 year old daughter- trying to implement   Time 2   Period Weeks   Status On-going           OT Long Term Goals - 08/19/15 1027    OT LONG TERM GOAL #1   Title Function on PRWHE improve with at least  10 points to return to use hand without brace pain free   Baseline Function on PRHWE 18/50 at eval    Time 4   Period Weeks   Status On-going   OT LONG TERM GOAL #2   Title Grip strength improve with 10 lbs to return to prior level of function    Baseline Grip  strength R55; L 30 lbs ; lat grip R 13, L 10   at eval    Time 4   Period Weeks   Status On-going               Plan - 08/19/15 1023    Clinical Impression Statement Pt report decrease pain at L thumb , less tenderness -more thumb stiffness - after fluido had no pain - pt fitted with neoprene thumb/wrist wrap to wear 2hrs on and off with thumb spica - And then  L more sore around wrist but still tender - pt to wear thumbspica but can also try wrap to alternate with because she is R hand dominnat it could be that she is fitghting the splint   - cont witth homeprogram modifying act and ice   Pt will benefit from skilled therapeutic intervention in order to improve on the following deficits (Retired) Pain;Impaired flexibility;Decreased strength;Decreased range of motion;Decreased coordination;Decreased activity tolerance   Rehab Potential Good   OT Frequency 2x / week   OT Duration 4 weeks   OT Treatment/Interventions Self-care/ADL training;Cryotherapy;Moist Heat;Contrast Bath;Fluidtherapy;Iontophoresis;Therapeutic exercise;Manual Therapy;Passive range of motion;Splinting;Patient/family education   Plan assess pain , splint use and modifying act   OT Home Exercise Plan see pt instruction   Consulted and Agree with Plan of Care Patient        Problem List Patient Active Problem List   Diagnosis Date Noted  . Vaginal delivery 08/09/2014  . Third degree perineal laceration 08/09/2014    Oletta Cohn OTR/L,CLT 08/19/2015, 10:29 AM  [facility] PHYSICAL AND SPORTS MEDICINE [address] [place], [address] Phone: [phone]   Fax:  [phone]  Name: Cynthia Farmer MRN: [id number] Date of Birth: 08-Nov-1985

## 2015-08-19 NOTE — Patient Instructions (Signed)
Alternate thumb spica with neoprene thumb /wrist strap 2hrs on and off -  If increase pain wear thumb spica longer - if better can wear wrap longer  Thumb AROM Opposition in heat 2 x day to decrease thumb stiffness And UD wave of wrist with thumb static  Cont modifying activities

## 2015-08-23 ENCOUNTER — Ambulatory Visit: Payer: BLUE CROSS/BLUE SHIELD | Admitting: Occupational Therapy

## 2015-08-26 ENCOUNTER — Ambulatory Visit: Payer: BLUE CROSS/BLUE SHIELD | Admitting: Occupational Therapy

## 2015-08-26 DIAGNOSIS — M25642 Stiffness of left hand, not elsewhere classified: Secondary | ICD-10-CM

## 2015-08-26 DIAGNOSIS — M25532 Pain in left wrist: Secondary | ICD-10-CM

## 2015-08-26 DIAGNOSIS — M6281 Muscle weakness (generalized): Secondary | ICD-10-CM

## 2015-08-26 DIAGNOSIS — M79631 Pain in right forearm: Secondary | ICD-10-CM

## 2015-08-26 DIAGNOSIS — M79642 Pain in left hand: Secondary | ICD-10-CM | POA: Diagnosis not present

## 2015-08-26 DIAGNOSIS — M25632 Stiffness of left wrist, not elsewhere classified: Secondary | ICD-10-CM

## 2015-08-26 NOTE — Therapy (Signed)
[place] [facility] PH[facility]ORTS MEDICINE [address] [place], [address] Phone: [phone]   Fax:  [phone]  Occupational Therapy Treatment  Patient Details  Name: Cynthia Farmer MRN: [id number] Date of Birth: 07-03-1986 No Data Recorded  Encounter Date: 08/26/2015      OT End of Session - 08/26/15 1006    Visit Number 6   Number of Visits 12   Date for OT Re-Evaluation 09/16/15   OT Start Time 0908   OT Stop Time 0955   OT Time Calculation (min) 47 min   Activity Tolerance Patient tolerated treatment well   Behavior During Therapy [facility] for tasks assessed/performed      Past Medical History  Diagnosis Date  . Hx of varicella   . Medical history non-contributory     No past surgical history on file.  There were no vitals filed for this visit.  Visit Diagnosis:  Pain of left hand  Left wrist pain  Wrist stiffness, left  Stiffness of finger joint, left  Muscle weakness  Pain of right forearm      Subjective Assessment - 08/26/15 1000    Subjective  I took liberty and started wearing the soft splint most all the time- L it feels good on - Right one I still put on the hard one at night time and at home when doing heavy activities - my L thumb just feel tight and weak - Left moving good    Patient Stated Goals I want to use my hand like before and the pain better    Currently in Pain? Yes   Pain Score 2             [facility] OT Assessment - 08/26/15 0001    AROM   Right Wrist Radial Deviation 15 Degrees   Right Wrist Ulnar Deviation 35 Degrees   Left Wrist Ulnar Deviation 15 Degrees   Right Hand AROM   R Thumb MCP 0-60 55 Degrees   R Thumb IP 0-80 90 Degrees   R Thumb Radial ABduction/ADduction 0-55 52   R Thumb Palmar ABduction/ADduction 0-45 52   Left Hand AROM   L Thumb MCP 0-60 35 Degrees   L Thumb IP 0-80 72 Degrees   L Thumb Radial ADduction/ABduction 0-55 45   L Thumb Palmar ADduction/ABduction  0-45 47                  OT Treatments/Exercises (OP) - 08/26/15 0001    Wrist Exercises   Other wrist exercises AROM  add for PA and RA , Opposition to L ; UD stretch for bilateral hands    Other wrist exercises R hand isometriic for ADD and  PA of thumb    Hand Exercises   Other Hand Exercises MEasurements taken for wrist and thumb ROM - see flowsheet    Electrical Stimulation   Electrical Stimulation Location R EPB and extensor mass   Electrical Stimulation Action 10   Electrical Stimulation Parameters protocol for hand .wrist / acute pain    LUE Fluidotherapy   Number Minutes Fluidotherapy 10 Minutes   LUE Fluidotherapy Location Hand;Wrist   Comments L thumb AROM in all palnes - PA , RA and opposition - feels good    Splinting   Splinting Light act keep neoprene on to increase ROM , night time and heavy act thum spica on L    Manual Therapy   Manual therapy comments Tender on L distal radius head ,  R  not so much                 OT Education - 08/26/15 1005    Education provided Yes   Education Details HEP   Person(s) Educated Patient   Methods Explanation;Demonstration;Tactile cues;Verbal cues;Handout   Comprehension Verbalized understanding;Returned demonstration;Verbal cues required          OT Short Term Goals - 08/19/15 1026    OT SHORT TERM GOAL #1   Title Pain on PRWHE improve by at least 10-15 points    Baseline pain on PRWHE 22/50 at eval   Time 4   Period Weeks   Status On-going   OT SHORT TERM GOAL #2   Title Pt to be ind in HEP  to increase AROM at wrist and thumb to [facility] when painfree   Baseline  Pt pain at the worse 8/10 and pain with wrist flexion and thumb flexion and extention at eval - pain this date 5/10   Time 4   Period Weeks   Status On-going   OT SHORT TERM GOAL #3   Title Pt verbalize at  least 4 modifications  and avoidance of provoking postures or activities in daily activities at home, with daughter and work     Baseline  work full time and has 24 year old daughter- trying to implement   Time 2   Period Weeks   Status On-going           OT Long Term Goals - 08/19/15 1027    OT LONG TERM GOAL #1   Title Function on PRWHE improve with at least  10 points to return to use hand without brace pain free   Baseline Function on PRHWE 18/50 at eval    Time 4   Period Weeks   Status On-going   OT LONG TERM GOAL #2   Title Grip strength improve with 10 lbs to return to prior level of function    Baseline Grip strength R55; L 30 lbs ; lat grip R 13, L 10   at eval    Time 4   Period Weeks   Status On-going               Plan - 08/26/15 1008    Clinical Impression Statement Pt making progress in pain - more stiffness appear to be in R - L no tenderness and ROM  about WNL - add AROM to be done after heat  - cont neoprene splints - and alternated to thumb spica still on the L -  reinforce modifications of tasks   Pt will benefit from skilled therapeutic intervention in order to improve on the following deficits (Retired) Pain;Impaired flexibility;Decreased strength;Decreased range of motion;Decreased coordination;Decreased activity tolerance   Rehab Potential Good   OT Frequency 2x / week   OT Duration 4 weeks   OT Treatment/Interventions Self-care/ADL training;Cryotherapy;Moist Heat;Contrast Bath;Fluidtherapy;Iontophoresis;Therapeutic exercise;Manual Therapy;Passive range of motion;Splinting;Patient/family education   Plan assess pain , HEP upgrade tolerance    OT Home Exercise Plan see pt instruction   Consulted and Agree with Plan of Care Patient        Problem List Patient Active Problem List   Diagnosis Date Noted  . Vaginal delivery 08/09/2014  . Third degree perineal laceration 08/09/2014    Oletta Cohn OTR/L,CLT 08/26/2015, 10:14 AM  [place] [facility] PHYSICAL AND SPORTS MEDICINE [address] [place], [address] Phone: [phone]    Fax:  [phone]  Name: Estell HarpinKristin Kijowski MRN: [id number] Date of Birth: 1986/09/29

## 2015-08-26 NOTE — Patient Instructions (Signed)
Isometric for R thumb ADD , PA  Bilateral - UD stretch   AROM to L thumb for PA and RA , Opposition   Pain free range  SPlint use

## 2015-08-29 ENCOUNTER — Ambulatory Visit: Payer: BLUE CROSS/BLUE SHIELD | Admitting: Occupational Therapy

## 2015-08-29 DIAGNOSIS — M25632 Stiffness of left wrist, not elsewhere classified: Secondary | ICD-10-CM

## 2015-08-29 DIAGNOSIS — M25642 Stiffness of left hand, not elsewhere classified: Secondary | ICD-10-CM

## 2015-08-29 DIAGNOSIS — M6281 Muscle weakness (generalized): Secondary | ICD-10-CM

## 2015-08-29 DIAGNOSIS — M79631 Pain in right forearm: Secondary | ICD-10-CM

## 2015-08-29 DIAGNOSIS — M79642 Pain in left hand: Secondary | ICD-10-CM | POA: Diagnosis not present

## 2015-08-29 DIAGNOSIS — M25532 Pain in left wrist: Secondary | ICD-10-CM

## 2015-08-29 NOTE — Patient Instructions (Signed)
Add wrist flexion/extentoion /RD and UD for R  Thumb stretch into fist - in UD  Same HEP for thumb as before for R    L same HEP as before - work on opposition to base of 5th , then thumb in fist and add UD - if need to can help with other hand but stretch less than 3/10

## 2015-08-29 NOTE — Therapy (Signed)
[place] [facility] PHYSICAL [facility] SPORTS MEDICINE [address], [place], [address] Phone: [phone]   Fax:  [phone]  Occupational Therapy Treatment  Patient Details  Name: Cynthia Farmer MRN: [id number] Date of Birth: Mar 27, 1986 No Data Recorded  Encounter Date: 08/29/2015      OT End of Session - 08/29/15 1611    Visit Number 7   Number of Visits 12   Date for OT Re-Evaluation 09/16/15   OT Start Time 1200   OT Stop Time 1244   OT Time Calculation (min) 44 min   Activity Tolerance Patient tolerated treatment well   Behavior During Therapy [facility] for tasks assessed/performed      Past Medical History  Diagnosis Date  . Hx of varicella   . Medical history non-contributory     No past surgical history on file.  There were no vitals filed for this visit.  Visit Diagnosis:  Pain of left hand  Left wrist pain  Wrist stiffness, left  Stiffness of finger joint, left  Muscle weakness  Pain of right forearm      Subjective Assessment - 08/29/15 1507    Subjective  About the same - I used my hands more because we were driving a lot - and in and out of car and Ava out of carseat - but no shooting pain more achiness and tired - did the exercises   Patient Stated Goals I want to use my hand like before and the pain better    Currently in Pain? Yes   Pain Score 1    Pain Location Wrist   Pain Orientation Left;Right   Pain Descriptors / Indicators Aching                      OT Treatments/Exercises (OP) - 08/29/15 0001    Wrist Exercises   Other wrist exercises Wrist ROM appear  to be intact - decrease pain in RD after fluido on the L    Hand Exercises   Other Hand Exercises AROM opposition to base of 5th , thumb in fist - and add UD - pt hard time on the L - needed additional stretch with other hand - keep stretch under 2/10    Other Hand Exercises R thumb opposition , add UD/ RD AROM ; stretch - as well as  Flexion and extnetion of wrist AROM ;    Moist Heat Therapy   Number Minutes Moist Heat 8 Minutes   Moist Heat Location Wrist   Cryotherapy   Number Minutes Cryotherapy 3 Minutes   Cryotherapy Location Wrist   Type of Cryotherapy Ice massage   LUE Fluidotherapy   Number Minutes Fluidotherapy 10 Minutes   LUE Fluidotherapy Location Hand;Wrist   Comments AROM for wrist and thum b in all planes - and UD with thumb in palm - but did not do to ggod with last one   Manual Therapy   Manual therapy comments Tenderness over distal radius head - L worse than R                 OT Education - 08/29/15 1611    Education provided Yes   Education Details HEP   Person(s) Educated Patient   Methods Explanation;Demonstration;Tactile cues;Verbal cues;Handout   Comprehension Verbal cues required;Returned demonstration;Verbalized understanding          OT Short Term Goals - 08/19/15 1026    OT SHORT TERM GOAL #1   Title Pain  on PRWHE improve by at least 10-15 points    Baseline pain on PRWHE 22/50 at eval   Time 4   Period Weeks   Status On-going   OT SHORT TERM GOAL #2   Title Pt to be ind in HEP  to increase AROM at wrist and [facility] when pain[facility]   Baseline  Pt pain at the worse 8/10 and pain with wrist flexion and thumb flexion and extention at eval - pain this date 5/10   Time 4   Period Weeks   Status On-going   OT SHORT TERM GOAL #3   Title Pt verbalize at  least 4 modifications  and avoidance of provoking postures or activities in daily activities at home, with daughter and work    Baseline  work full time and has 31 year old daughter- trying to implement   Time 2   Period Weeks   Status On-going           OT Long Term Goals - 08/19/15 1027    OT LONG TERM GOAL #1   Title Function on PRWHE improve with at least  10 points to return to use hand without brace pain free   Baseline Function on PRHWE 18/50 at eval    Time 4   Period Weeks   Status On-going   OT  LONG TERM GOAL #2   Title Grip strength improve with 10 lbs to return to prior level of function    Baseline Grip strength R55; L 30 lbs ; lat grip R 13, L 10   at eval    Time 4   Period Weeks   Status On-going               Plan - 08/29/15 1612    Clinical Impression Statement Pt cont to make slow but steady progress - in pain , use of neoprene splints- but show stiffness in L thumb more than R - hard time doing composite  flexion of thumb - and add UD - pt to use heat prior to stretch    Pt will benefit from skilled therapeutic intervention in order to improve on the following deficits (Retired) Pain;Impaired flexibility;Decreased strength;Decreased range of motion;Decreased coordination;Decreased activity tolerance   Rehab Potential Good   OT Frequency 2x / week   OT Duration 4 weeks   OT Treatment/Interventions Self-care/ADL training;Cryotherapy;Moist Heat;Contrast Bath;Fluidtherapy;Iontophoresis;Therapeutic exercise;Manual Therapy;Passive range of motion;Splinting;Patient/family education   Plan assess pain , HEP and ROM in R thumb and UD    OT Home Exercise Plan see pt instruction   Consulted and Agree with Plan of Care Patient        Problem List Patient Active Problem List   Diagnosis Date Noted  . Vaginal delivery 08/09/2014  . Third degree perineal laceration 08/09/2014    Oletta CohnuPreez, Sereen Schaff OTR/L,CLT 08/29/2015, 4:16 PM  [facility]IC[facility] [address] [place], [place], [address] Phone: [phone]   Fax:  [phone]  Name: Cynthia Farmer MRN: [id number] Date of Birth: February 29, 1986

## 2015-09-02 ENCOUNTER — Encounter: Payer: BLUE CROSS/BLUE SHIELD | Admitting: Occupational Therapy

## 2015-09-05 ENCOUNTER — Encounter: Payer: Self-pay | Admitting: Physical Therapy

## 2015-09-05 ENCOUNTER — Ambulatory Visit: Payer: BLUE CROSS/BLUE SHIELD | Admitting: Physical Therapy

## 2015-09-05 DIAGNOSIS — M79642 Pain in left hand: Secondary | ICD-10-CM | POA: Diagnosis not present

## 2015-09-05 DIAGNOSIS — R279 Unspecified lack of coordination: Secondary | ICD-10-CM

## 2015-09-05 DIAGNOSIS — M629 Disorder of muscle, unspecified: Secondary | ICD-10-CM

## 2015-09-05 NOTE — Therapy (Deleted)
[place] [facility] [address] [place], [place], [address] Phone: [phone]   Fax:  [phone]  Patient Details  Name: Cynthia HarpinKristin Farmer MRN: [id number] Date of Birth: 04/20/1986 Referring Provider:  Myna Hidalgozan, Jennifer, DO  Encounter Date: 09/05/2015   Mariane MastersYeung,Shin Yiing ,PT, DPT, E-RYT  09/05/2015, 7:40 PM  [place] [facility] [address] [place], [place], [address] Phone: [phone]   Fax:  [phone]

## 2015-09-07 ENCOUNTER — Ambulatory Visit: Payer: BLUE CROSS/BLUE SHIELD | Attending: Surgery | Admitting: Occupational Therapy

## 2015-09-07 ENCOUNTER — Encounter: Payer: BLUE CROSS/BLUE SHIELD | Admitting: Occupational Therapy

## 2015-09-07 DIAGNOSIS — N8184 Pelvic muscle wasting: Secondary | ICD-10-CM | POA: Diagnosis present

## 2015-09-07 DIAGNOSIS — M6281 Muscle weakness (generalized): Secondary | ICD-10-CM | POA: Diagnosis present

## 2015-09-07 DIAGNOSIS — M25532 Pain in left wrist: Secondary | ICD-10-CM

## 2015-09-07 DIAGNOSIS — M25642 Stiffness of left hand, not elsewhere classified: Secondary | ICD-10-CM | POA: Diagnosis present

## 2015-09-07 DIAGNOSIS — M25632 Stiffness of left wrist, not elsewhere classified: Secondary | ICD-10-CM | POA: Diagnosis present

## 2015-09-07 DIAGNOSIS — R279 Unspecified lack of coordination: Secondary | ICD-10-CM | POA: Insufficient documentation

## 2015-09-07 DIAGNOSIS — M629 Disorder of muscle, unspecified: Secondary | ICD-10-CM | POA: Diagnosis present

## 2015-09-07 DIAGNOSIS — M79642 Pain in left hand: Secondary | ICD-10-CM | POA: Diagnosis present

## 2015-09-07 DIAGNOSIS — M79631 Pain in right forearm: Secondary | ICD-10-CM | POA: Diagnosis present

## 2015-09-07 NOTE — Patient Instructions (Signed)
Pt can try and sleep without splints  Work 2 hrs on and off on R  L can take longer off during day - but hard thumb spica on when working with daughter at night time on the R   After heat   thumb flexion , opposition to base of 5th  Stretch  RD and UD AROM   AROM wrist flexion , wrist extention stretch  8 reps  2 x day

## 2015-09-07 NOTE — Therapy (Signed)
[place] [facility] [address] [place], [place], [address] Phone: [phone]   Fax:  [phone]  Physical Therapy Evaluation  Patient Details  Name: Cynthia HarpinKristin Farmer MRN: [id number] Date of Birth: 06/22/1986 Referring Provider: Charlotta Newtonzan  Encounter Date: 09/05/2015    Past Medical History  Diagnosis Date  . Hx of varicella   . Medical history non-contributory     Past Surgical History  Procedure Laterality Date  . Stitches post-3rd degree tear during child birth 2015  2015    There were no vitals filed for this visit.  Visit Diagnosis:  Fascial defect  Lack of coordination      Subjective Assessment - 09/07/15 2336    Subjective Pt reported she had pain after the perineal stitches post 3rd degree tear after first childbirth. Pt continued to feel pain in the perineal area and was advised by her providers to Pelvic Health PT after she showed signs of healing  7 month later. Pt completed 3 visits with Pelvic health Therapist in [place] and underwent perineal massages over the pelvic floor mm and scar. Today, pt experiences basic sexual intercourse without pain but 5/10 pain occurs with increased movements in a non-symmetrical configuration. Pt has not been performing any home exercises from previous PT session. Pt found self-scar massage was "odd" and not being being comfortable with it.  Denied SUI but  has noticed increased frequency 1x/ every 2 hours. Constipation has improved to 1x /day with good consistency.       Pertinent History Hx of 1st pregnancy: difficulty with walking long distances in the last weeks, 10 hours of labor, 7 lb, 9.5 oz. Hobbies: dance, ballroom dancing    Patient Stated Goals pt would like to make any type of movement she would like to and not have it feel painful             [facility] - 09/07/15 2318    Assessment   Medical Diagnosis Pelvic Pain   Referring Provider Ozan   Precautions   Precautions Other (comment)   Restrictions   Weight Bearing Restrictions No   Balance Screen   Has the patient fallen in the past 6 months No   Observation/Other Assessments   Other Surveys  --  FSFI 72%    Coordination   Gross Motor Movements are Fluid and Coordinated --  lumbopelvic instability with ASLR   Fine Motor Movements are Fluid and Coordinated --  decreased pelvic floor ROM with breathing   Posture/Postural Control   Posture Comments poor sitting posture                 Pelvic Floor Special Questions - 09/07/15 2320    Pelvic Floor Internal Exam pt provided verbal consent with no contraindications   Exam Type Vaginal   Palpation tenderness over perineal scar, increased tensions /tenderness L > R  obt int (pre-Tx),  decreased tenderness and tensions post-Tx   Strength --   2/5 R > L pre-Tx, 3/5 post-Tx   Biofeedback visual/ tactile cuing for pelvic floor ROM coordinated with diaphragmatic breathing                       PT Long Term Goals - 09/07/15 2332    PT LONG TERM GOAL #1   Title Pt will increase her FSFI score from 72% to > 80% in order to improve QOL.   Time 12   Period Weeks   Status  New   PT LONG TERM GOAL #2   Title Pt will demo increased scar mobility with no report of pain/tenderness with palpation over scar in order to progress to pelvic floor/ deep core strengthening.    Time 12   Period Weeks   Status New   PT LONG TERM GOAL #3   Title Pt will demo no tenderness nor tensions in obturator internus muscles bilaterally in order to improve tolerate sexual intercourse without pain.   Time 12   Period Weeks   Status New                Problem List Patient Active Problem List   Diagnosis Date Noted  . Vaginal delivery 08/09/2014  . Third degree perineal laceration 08/09/2014    Elisha Ponder, DPT, E-RYT  09/07/2015, 11:39 PM  [facility] MAIN [facility] SERVICES [address] [place], [place], [address] Phone: [phone]   Fax:  [phone]  Name: Cynthia Farmer MRN: [id number] Date of Birth: 1986/01/11

## 2015-09-07 NOTE — Therapy (Signed)
[place] [facility] PHYSICAL AND SPORTS MEDICINE [address] [place], [place], [address] Phone: [phone]   Fax:  [phone]  Occupational Therapy Treatment  Patient Details  Name: Cynthia HarpinKristin Tino MRN: [id number] Date of Birth: 1986-03-15 No Data Recorded  Encounter Date: 09/07/2015      OT End of Session - 09/07/15 0928    Visit Number 8   Number of Visits 12   Date for OT Re-Evaluation 09/16/15   OT Start Time 0841   OT Stop Time 0924   OT Time Calculation (min) 43 min   Activity Tolerance Patient tolerated treatment well   Behavior During Therapy [facility] for tasks assessed/performed      Past Medical History  Diagnosis Date  . Hx of varicella   . Medical history non-contributory     Past Surgical History  Procedure Laterality Date  . Stitches post-3rd degree tear during child birth 2015  2015    There were no vitals filed for this visit.  Visit Diagnosis:  Left wrist pain  Wrist stiffness, left  Stiffness of finger joint, left  Muscle weakness      Subjective Assessment - 09/07/15 0857    Subjective  Sleeping still with splints - more flexibility in R thumb  but not wrist- still sometightness- but pain better - L more sore everyday at the end of day when busy with [employer]va -    Patient Stated Goals I want to use my hand like before and the pain better    Currently in Pain? Yes   Pain Score 4    Pain Location Wrist   Pain Orientation Right   Pain Descriptors / Indicators Aching   Pain Onset In the past 7 days                      OT Treatments/Exercises (OP) - 09/07/15 0001    Wrist Exercises   Other wrist exercises Wrist flexion AROM , extention stretch done L ; RD and UD  AROM on L after fuido , L thumbflexion to base of 5th    Other wrist exercises AROM of wrist in all planes R - pain with thumb  flexion and UD    Electrical Stimulation   Electrical Stimulation Location R EPB and extensor mass   Electrical  Stimulation Action 15   Electrical Stimulation Parameters Protocol for wrist and thumb pain for acute pain    Electrical Stimulation Goals Pain                OT Education - 09/07/15 [phone]    Education provided Yes   Education Details HEP   Person(s) Educated Patient   Methods Explanation;Demonstration;Tactile cues;Verbal cues;Handout   Comprehension Verbal cues required;Returned demonstration;Verbalized understanding          OT Short Term Goals - 08/19/15 1026    OT SHORT TERM GOAL #1   Title Pain on PRWHE improve by at least 10-15 points    Baseline pain on PRWHE 22/50 at eval   Time 4   Period Weeks   Status On-going   OT SHORT TERM GOAL #2   Title Pt to be ind in HEP  to increase AROM at wrist and thumb to [facility] when painfree   Baseline  Pt pain at the worse 8/10 and pain with wrist flexion and thumb flexion and extention at eval - pain this date 5/10   Time 4   Period Weeks   Status On-going  OT SHORT TERM GOAL #3   Title Pt verbalize at  least 4 modifications  and avoidance of provoking postures or activities in daily activities at home, with daughter and work    Baseline  work full time and has 62 year old daughter- trying to implement   Time 2   Period Weeks   Status On-going           OT Long Term Goals - 08/19/15 1027    OT LONG TERM GOAL #1   Title Function on PRWHE improve with at least  10 points to return to use hand without brace pain free   Baseline Function on PRHWE 18/50 at eval    Time 4   Period Weeks   Status On-going   OT LONG TERM GOAL #2   Title Grip strength improve with 10 lbs to return to prior level of function    Baseline Grip strength R55; L 30 lbs ; lat grip R 13, L 10   at eval    Time 4   Period Weeks   Status On-going               Plan - 09/07/15 0929    Clinical Impression Statement Pt progressing in pain and ROM on the L - more stiffness now. R  wrist /1st dorsal compartment still tender  and pain with  UD/thumb ADD  - pt to wear splints less during day at work - no sleep with them - hard thumb spica on  R hand when working with daughter  - cont increase ROM at L    Pt will benefit from skilled therapeutic intervention in order to improve on the following deficits (Retired) Pain;Impaired flexibility;Decreased strength;Decreased range of motion;Decreased coordination;Decreased activity tolerance   Rehab Potential Good   OT Frequency 2x / week   OT Duration 2 weeks   OT Treatment/Interventions Self-care/ADL training;Cryotherapy;Moist Heat;Contrast Bath;Fluidtherapy;Iontophoresis;Therapeutic exercise;Manual Therapy;Passive range of motion;Splinting;Patient/family education   Plan assess pain, splint wearing , HEP and ROM    OT Home Exercise Plan see pt instruction   Consulted and Agree with Plan of Care Patient        Problem List Patient Active Problem List   Diagnosis Date Noted  . Vaginal delivery 08/09/2014  . Third degree perineal laceration 08/09/2014    Oletta Cohn OTR/L,CLT 09/07/2015, 9:33 AM  [place] [facility] PHYSICAL AND SPORTS MEDICINE [address], [place], [address] Phone: [phone]   Fax:  [phone]  Name: Cynthia Farmer MRN: [id number] Date of Birth: 06/29/1986

## 2015-09-07 NOTE — Therapy (Signed)
[place] [facility] [address] [place], [place], [address] Phone: [phone]   Fax:  [phone]  Physical Therapy Evaluation  Patient Details  Name: Cynthia HarpinKristin Farmer MRN: [id number] Date of Birth: 11-18-1985 Referring Provider: Charlotta Newtonzan  Encounter Date: 09/05/2015      PT End of Session - 09/07/15 2344    Visit Number 1   Number of Visits 12   Date for PT Re-Evaluation 11/14/15   PT Start Time 1600   PT Stop Time 1710   PT Time Calculation (min) 70 min      Past Medical History  Diagnosis Date  . Hx of varicella   . Medical history non-contributory     Past Surgical History  Procedure Laterality Date  . Stitches post-3rd degree tear during child birth 2015  2015    There were no vitals filed for this visit.  Visit Diagnosis:  Fascial defect - Plan: PT plan of care cert/re-cert  Lack of coordination - Plan: PT plan of care cert/re-cert      Subjective Assessment - 09/07/15 2336    Subjective Pt reported she had pain after the perineal stitches post 3rd degree tear after first childbirth. Pt continued to feel pain in the perineal area and was advised by her providers to [facility]vic [facility] PT after she showed signs of healing  7 month later. Pt completed 3 visits with Pelvic health Therapist in [place] and underwent perineal massages over the pelvic floor mm and scar. Today, pt can experience basic sexual intercourse positions without pain but 5/10 pain occurs with increased movements in a non-symmetrical configuration. Pt has not been performing any home exercises from previous PT session. Pt found self-scar massage was "odd" and not being being comfortable with it.  Denied SUI but  has noticed increased frequency 1x/ every 2 hours. Constipation has improved to 1x /day with good consistency.       Pertinent History Hx of 1st pregnancy: difficulty with walking long distances in the last weeks, 10 hours of labor, 7 lb, 9.5 oz. Hobbies:  dance, ballroom dancing    Patient Stated Goals pt would like to make any type of movement she would like to and not have it feel painful             [facility] - 09/07/15 2318    Assessment   Medical Diagnosis Dyspareunia   Referring Provider Ozan   Precautions   Precautions Other (comment)   Restrictions   Weight Bearing Restrictions No   Balance Screen   Has the patient fallen in the past 6 months No   Observation/Other Assessments   Other Surveys  --  FSFI 72%    Coordination   Gross Motor Movements are Fluid and Coordinated --  lumbopelvic instability with ASLR   Fine Motor Movements are Fluid and Coordinated --  decreased pelvic floor ROM with breathing   Posture/Postural Control   Posture Comments poor sitting posture                 Pelvic Floor Special Questions - 09/07/15 2320    Pelvic Floor Internal Exam pt provided verbal consent with no contraindications   Exam Type Vaginal   Palpation tenderness over perineal scar, increased tensions /tenderness L > R  obt int (pre-Tx),  decreased tenderness and tensions post-Tx   Strength --   2/5 R > L pre-Tx, 3/5 post-Tx   Biofeedback visual/ tactile cuing for pelvic floor ROM coordinated with  diaphragmatic breathing                       PT Long Term Goals - 09/07/15 2332    PT LONG TERM GOAL #1   Title Pt will increase her FSFI score from 72% to > 80% in order to improve QOL.   Time 12   Period Weeks   Status New   PT LONG TERM GOAL #2   Title Pt will demo increased scar mobility with no report of pain/tenderness with palpation over scar in order to progress to pelvic floor/ deep core strengthening.    Time 12   Period Weeks   Status New   PT LONG TERM GOAL #3   Title Pt will demo no tenderness nor tensions in obturator internus muscles bilaterally in order to improve tolerate sexual intercourse without pain.   Time 12   Period Weeks   Status New               Plan  - 09/07/15 2340    Clinical Impression Statement Pt is a 29 yo female whose S & Sx consist of perineal scar restriction, pelvic floor mm tensions and tenderness, poor pelvic floor ROM with diaphragmatic breathing,  poor coordination of deep core mm, and poor posture.  These deficits limpact her QOL.    Pt will benefit from skilled therapeutic intervention in order to improve on the following deficits Decreased strength;Decreased activity tolerance;Decreased endurance;Decreased range of motion;Decreased coordination;Decreased mobility;Decreased scar mobility;Increased muscle spasms;Postural dysfunction;Increased fascial restricitons;Impaired sensation;Pain;Improper body mechanics;Decreased safety awareness;Decreased knowledge of precautions;Impaired flexibility   Rehab Potential Good   PT Frequency 1x / week   PT Duration 12 weeks   PT Treatment/Interventions ADLs/Self Care Home Management;Biofeedback;Cryotherapy;Electrical Stimulation;Neuromuscular re-education;Balance training;Therapeutic exercise;Manual techniques;Functional mobility training;Patient/family education;Therapeutic activities;Scar mobilization;Passive range of motion;Energy conservation;Taping;Moist Heat;Traction;Gait training   Consulted and Agree with Plan of Care Patient         Problem List Patient Active Problem List   Diagnosis Date Noted  . Vaginal delivery 08/09/2014  . Third degree perineal laceration 08/09/2014    Mariane Masters  ,PT, DPT, E-RYT  09/07/2015, 11:47 PM  [place] [facility] MAIN [facility] SERVICES [address] [place], [place], [address] Phone: [phone]   Fax:  [phone]  Name: Cynthia Farmer MRN: [id number] Date of Birth: [birth year]

## 2015-09-07 NOTE — Addendum Note (Signed)
Addended by: Mariane MastersYEUNG, SHIN-YIING on: 09/07/2015 11:48 PM   Modules accepted: Orders

## 2015-09-09 ENCOUNTER — Ambulatory Visit: Payer: BLUE CROSS/BLUE SHIELD | Admitting: Occupational Therapy

## 2015-09-09 DIAGNOSIS — M79642 Pain in left hand: Secondary | ICD-10-CM

## 2015-09-09 DIAGNOSIS — M25532 Pain in left wrist: Secondary | ICD-10-CM | POA: Diagnosis not present

## 2015-09-09 DIAGNOSIS — M25642 Stiffness of left hand, not elsewhere classified: Secondary | ICD-10-CM

## 2015-09-09 DIAGNOSIS — M79631 Pain in right forearm: Secondary | ICD-10-CM

## 2015-09-09 DIAGNOSIS — M25632 Stiffness of left wrist, not elsewhere classified: Secondary | ICD-10-CM

## 2015-09-09 DIAGNOSIS — M6281 Muscle weakness (generalized): Secondary | ICD-10-CM

## 2015-09-09 NOTE — Patient Instructions (Signed)
Add to L hand isometric for thumb RA , palm on table attempt to lift thumb up to ceiling ( isometric) 8 x  Thumb RA with IP flexed - 8 reps - UD with thumb static 10 reps

## 2015-09-09 NOTE — Therapy (Signed)
[place] [facility] PHYSICAL AND SPORTS MEDICINE [address] [place], [place], [address] Phone: [phone]   Fax:  [phone]  Occupational Therapy Treatment  Patient Details  Name: Cynthia HarpinKristin Farmer MRN: [id number] Date of Birth: 1986-07-13 No Data Recorded  Encounter Date: 09/09/2015      OT End of Session - 09/09/15 1413    Visit Number 9   Number of Visits 12   Date for OT Re-Evaluation 09/16/15   OT Start Time 1302   OT Stop Time 1345   OT Time Calculation (min) 43 min   Activity Tolerance Patient tolerated treatment well   Behavior During Therapy [facility] for tasks assessed/performed      Past Medical History  Diagnosis Date  . Hx of varicella   . Medical history non-contributory     Past Surgical History  Procedure Laterality Date  . Stitches post-3rd degree tear during child birth 2015  2015    There were no vitals filed for this visit.  Visit Diagnosis:  Left wrist pain  Wrist stiffness, left  Stiffness of finger joint, left  Muscle weakness  Pain of left hand  Pain of right forearm      Subjective Assessment - 09/09/15 1404    Subjective  L hand doing okay - not sleeping with any splint - and going between soft one and notthing during day - feel it about 2-3 x and more in evening - but R one bothering me more - I am wearing hard splint - pain about 6/10 - and that tendon feel it is popping    Patient Stated Goals I want to use my hand like before and the pain better    Currently in Pain? Yes   Pain Score 6    Pain Location Wrist   Pain Orientation Right   Pain Descriptors / Indicators Aching   Pain Type Acute pain                      OT Treatments/Exercises (OP) - 09/09/15 0001    Hand Exercises   Other Hand Exercises Started [place] strength - for thumRA isometric, palm on table and thumb up to ceiling (isometric); thumb RA with IP flexed  8 reps all    Other Hand Exercises UD with thumb static 10  reps    Electrical Stimulation   Electrical Stimulation Location R EPB and extensor mass   Electrical Stimulation Action 15   Electrical Stimulation Parameters protocol - e stim - acute pain - hand and wrist pain    Electrical Stimulation Goals Pain   LUE Fluidotherapy   Number Minutes Fluidotherapy 10 Minutes   LUE Fluidotherapy Location Hand;Wrist   [facility] to  decrease pain and increase ROM at L thumb and wrist    Splinting   Splinting prefab thumb spica on R hand and only off iwth ADL's to decrease stiffness                 OT Education - 09/09/15 1412    Education provided Yes   Education Details HEP and splint wearing   Person(s) Educated Patient   Methods Explanation;Demonstration;Tactile cues;Verbal cues;Handout   Comprehension Verbalized understanding;Returned demonstration;Verbal cues required          OT Short Term Goals - 09/09/15 1416    OT SHORT TERM GOAL #1   Title Pain on PRWHE improve by at least 10-15 points    Baseline pain on PRWHE 22/50  at eval   Time 2   Period Weeks   Status On-going   OT SHORT TERM GOAL #2   Title Pt to be ind in HEP  to increase AROM at wrist and thumb to [facility] when painfree   Baseline pain R worse 6/10 - L only feel pull 2-3 x day mostly in evening   Time 2   Period Weeks   Status On-going   OT SHORT TERM GOAL #3   Title Pt verbalize at  least 4 modifications  and avoidance of provoking postures or activities in daily activities at home, with daughter and work    Status Achieved           OT Long Term Goals - 09/09/15 1417    OT LONG TERM GOAL #1   Title Function on PRWHE improve with at least  10 points to return to use hand without brace pain free   Baseline Function on PRHWE 18/50 at eval    Time 2   Period Weeks   Status On-going   OT LONG TERM GOAL #2   Title Grip strength improve with 10 lbs to return to prior level of function    Baseline Grip strength R55; L 30 lbs ; lat grip R 13, L 10   at eval     Time 2   Period Weeks   Status On-going               Plan - 09/09/15 1413    Clinical Impression Statement  Pt L wrist and hand improving - still mostly feeling it about 2-3 x in evening helping one year old daughter - R hand worse the last few days - and popping at wrist/thumb per pt - recommned cont wearing thumb spica prefab on R - and still neoprene and nothing for L - made appt for pt to see Dr Joice Lofts next week for assessment of R wrist and hand - because of  breast feeding pt not candidate  for ionto with dexamethasone    Pt will benefit from skilled therapeutic intervention in order to improve on the following deficits (Retired) Pain;Impaired flexibility;Decreased strength;Decreased range of motion;Decreased coordination;Decreased activity tolerance   Rehab Potential Good   OT Frequency 2x / week   OT Duration 4 weeks   OT Treatment/Interventions Self-care/ADL training;Cryotherapy;Moist Heat;Contrast Bath;Fluidtherapy;Iontophoresis;Therapeutic exercise;Manual Therapy;Passive range of motion;Splinting;Patient/family education   Plan assess pain , splint wearing - tolerace for HEP    OT Home Exercise Plan see pt instruction   Consulted and Agree with Plan of Care Patient        Problem List Patient Active Problem List   Diagnosis Date Noted  . Vaginal delivery 08/09/2014  . Third degree perineal laceration 08/09/2014    Oletta Cohn OTR/L,CLT 09/09/2015, 2:19 PM  [place] [facility] PH[facility]ICAL AND SPORTS MEDICINE [address], [place], [address] Phone: [phone]   Fax:  [phone]  Name: Cynthia Farmer MRN: [id number] Date of Birth: 09/14/1986

## 2015-09-12 ENCOUNTER — Ambulatory Visit: Payer: BLUE CROSS/BLUE SHIELD | Admitting: Occupational Therapy

## 2015-09-12 DIAGNOSIS — M25532 Pain in left wrist: Secondary | ICD-10-CM

## 2015-09-12 DIAGNOSIS — M25632 Stiffness of left wrist, not elsewhere classified: Secondary | ICD-10-CM

## 2015-09-12 DIAGNOSIS — M79631 Pain in right forearm: Secondary | ICD-10-CM

## 2015-09-12 DIAGNOSIS — M6281 Muscle weakness (generalized): Secondary | ICD-10-CM

## 2015-09-12 NOTE — Therapy (Signed)
[place] [facility] PHYS[facility] SPORTS MEDICINE [address], [place], [address] Phone: [phone]   Fax:  [phone]  Occupational Therapy Treatment  Patient Details  Name: Cynthia Farmer MRN: [id number] Date of Birth: 10/07/1986 No Data Recorded  Encounter Date: 09/12/2015      OT End of Session - 09/12/15 0941    Visit Number 10   Number of Visits 12   Date for OT Re-Evaluation 09/16/15   OT Start Time 0905   OT Stop Time 0940   OT Time Calculation (min) 35 min   Activity Tolerance Patient tolerated treatment well   Behavior During Therapy [facility] for tasks assessed/performed      Past Medical History  Diagnosis Date  . Hx of varicella   . Medical history non-contributory     Past Surgical History  Procedure Laterality Date  . Stitches post-3rd degree tear during child birth 2015  2015    There were no vitals filed for this visit.  Visit Diagnosis:  Left wrist pain  Wrist stiffness, left  Muscle weakness  Pain of right forearm      Subjective Assessment - 09/12/15 0937    Subjective  It is better - I wore my hard splint on the R most all the time except sleeping - and then soft splint over weekend because I was with Ava most all the time - but not sleeping with any splints - now today at work I will take splints  off    Patient Stated Goals I want to use my hand like before and the pain better    Currently in Pain? Yes   Pain Score 2    Pain Location Wrist   Pain Orientation Right   Pain Descriptors / Indicators Aching            [facility] OT Assessment - 09/12/15 0001    AROM   Left Wrist Extension 50 Degrees   Left Wrist Flexion 84 Degrees   Left Wrist Radial Deviation 17 Degrees   Left Wrist Ulnar Deviation 30 Degrees   Left Hand AROM   L Thumb IP 0-80 85 Degrees   L Thumb Radial ADduction/ABduction 0-55 51   L Thumb Palmar ADduction/ABduction 0-45 50                  OT Treatments/Exercises  (OP) - 09/12/15 0001    Wrist Exercises   Other wrist exercises Wrist PROM extention done 10 reps and add to HEP , composite thumb flexion with UD    Other wrist exercises Measurements taken - see flowsheet for wrist and thumb - great progress from [facility]   Hand Exercises   Other Hand Exercises De Quervain strength - for thumRA isometric, palm on table and thumb up to ceiling (isometric); thumb RA with IP flexed  8 reps all    Other Hand Exercises UD with thumb static 10 reps    Electrical Stimulation   Electrical Stimulation Location R EPB and extensor mass   Electrical Stimulation Action 12   Electrical Stimulation Parameters protocol acute pain for hand and wrist    Electrical Stimulation Goals Pain   LUE Fluidotherapy   Number Minutes Fluidotherapy 10 Minutes   LUE Fluidotherapy Location Hand;Wrist   Comments At [facility] to increase wrist extention and composite flexion for UD and thumb flexion                 OT Education - 09/12/15 1324  Education provided Yes   Education Details HEP and splint wearing   Person(s) Educated Patient   Methods Explanation;Demonstration;Tactile cues;Verbal cues   Comprehension Verbalized understanding;Returned demonstration;Verbal cues required          OT Short Term Goals - 09/09/15 1416    OT SHORT TERM GOAL #1   Title Pain on PRWHE improve by at least 10-15 points    Baseline pain on PRWHE 22/50 at eval   Time 2   Period Weeks   Status On-going   OT SHORT TERM GOAL #2   Title Pt to be ind in HEP  to increase AROM at wrist and thumb to [facility] when pain[facility]   Baseline pain R worse 6/10 - L only feel pull 2-3 x day mostly in evening   Time 2   Period Weeks   Status On-going   OT SHORT TERM GOAL #3   Title Pt verbalize at  least 4 modifications  and avoidance of provoking postures or activities in daily activities at home, with daughter and work    Status Achieved           OT Long Term Goals - 09/09/15 1417    OT LONG TERM GOAL  #1   Title Function on PRWHE improve with at least  10 points to return to use hand without brace pain free   Baseline Function on PRHWE 18/50 at eval    Time 2   Period Weeks   Status On-going   OT LONG TERM GOAL #2   Title Grip strength improve with 10 lbs to return to prior level of function    Baseline Grip strength R55; L 30 lbs ; lat grip R 13, L 10   at eval    Time 2   Period Weeks   Status On-going               Plan - 09/12/15 0942    Clinical Impression Statement Pt L wrist and thumb cont to improve did not had pain with HEP and use - did use still neoprene splint over weekend because of with one year old daughter most all the ime - did prefab thumb spica on R during day and sleeping with no splint -  pain did decreas eto 2/10 from 6/10 at R  wrist - pt to cont with prefab thumb spica on R  - [facility]  AROM    Pt will benefit from skilled therapeutic intervention in order to improve on the following deficits (Retired) Pain;Impaired flexibility;Decreased strength;Decreased range of motion;Decreased coordination;Decreased activity tolerance   Rehab Potential Good   OT Frequency 2x / week   OT Duration 4 weeks   OT Treatment/Interventions Self-care/ADL training;Cryotherapy;Moist Heat;Contrast Bath;Fluidtherapy;Iontophoresis;Therapeutic exercise;Manual Therapy;Passive range of motion;Splinting;Patient/family education   Plan assess  pain , grip and prehension assess - upgrade HEP    OT Home Exercise Plan see pt instruction   Consulted and Agree with Plan of Care Patient        Problem List Patient Active Problem List   Diagnosis Date Noted  . Vaginal delivery 08/09/2014  . Third degree perineal laceration 08/09/2014    Oletta CohnuPreez, Olof Marcil OTR/L,CLT 09/12/2015, 9:46 AM  [place] [facility] [address] [place], [place], [address] Phone: [phone]   Fax:   [phone]  Name: Cynthia HarpinKristin Iodice MRN: [id number] Date of Birth: [birth year]

## 2015-09-12 NOTE — Patient Instructions (Signed)
Same as last time but add L wrist extention PROM

## 2015-09-13 ENCOUNTER — Ambulatory Visit: Payer: BLUE CROSS/BLUE SHIELD | Admitting: Physical Therapy

## 2015-09-13 DIAGNOSIS — M6289 Other specified disorders of muscle: Secondary | ICD-10-CM

## 2015-09-13 DIAGNOSIS — M629 Disorder of muscle, unspecified: Secondary | ICD-10-CM

## 2015-09-13 DIAGNOSIS — R279 Unspecified lack of coordination: Secondary | ICD-10-CM

## 2015-09-13 DIAGNOSIS — M25532 Pain in left wrist: Secondary | ICD-10-CM | POA: Diagnosis not present

## 2015-09-13 NOTE — Patient Instructions (Signed)
Pelvic tilts with UE overhead 10 reps   Frog stretch with heels apart on exhale (45 deg) in prone   Dynamic stabilization level 2 10 reps/ 3 set

## 2015-09-13 NOTE — Therapy (Signed)
[place] [facility] MAIN [facility] SERVICES [address] [place], [place], [address] Phone: [phone]   Fax:  [phone]  Physical Therapy Treatment  Patient Details  Name: Cynthia Farmer MRN: [id number] Date of Birth: May 02, 1986 Referring Provider: Charlotta Newton  Encounter Date: 09/13/2015      PT End of Session - 09/13/15 1702    Visit Number 2   Number of Visits 12   Date for PT Re-Evaluation 11/14/15   PT Start Time 1600   PT Stop Time 1700   PT Time Calculation (min) 60 min   Behavior During Therapy [facility] for tasks assessed/performed      Past Medical History  Diagnosis Date  . Hx of varicella   . Medical history non-contributory     Past Surgical History  Procedure Laterality Date  . Stitches post-3rd degree tear during child birth 2015  2015    There were no vitals filed for this visit.  Visit Diagnosis:  Lack of coordination  Pelvic floor dysfunction  Fascial defect      Subjective Assessment - 09/13/15 1606    Subjective Pt reported she felt sore in theperineum post-Tx while walking to her car and the soreness dissipated quickly. Pt has been catching  herself to recorrect her posture.                       Pelvic Floor Special Questions - 09/13/15 1607    Pelvic Floor Internal Exam pt provided verbal consent with no contraindications   Exam Type Vaginal   Palpation tenderness only over R iliococcygeus, L/R obt int   scar no longer tender to palpation           [facility] Adult PT Treatment/Exercise - 09/13/15 1658    Neuro Re-ed    Neuro Re-ed Details  pelvic tilts, frog stretch in prone, and dynamic stbailization 2   guided body scan 2 cycles during application of moist heat   Moist Heat Therapy   Number Minutes Moist Heat 10 Minutes   Moist Heat Location --  perineal area with undergarments donned, sheet, 3 towels                PT Education - 09/13/15 1701    Education provided Yes   Education  Details HEP   Person(s) Educated Patient   Methods Explanation;Demonstration;Tactile cues;Verbal cues   Comprehension Verbalized understanding;Returned demonstration          PT Short Term Goals - 03/15/15 1609    PT SHORT TERM GOAL #1   Title understand how to perform perineal soft tissue work   Period Weeks   Status New  not to do due to opening not healed   PT SHORT TERM GOAL #3   Title understand what a dilator is and how to use it to open up the intoritus for intercourse   Time 4   Period Weeks   Status Achieved           PT Long Term Goals - 09/07/15 2332    PT LONG TERM GOAL #1   Title Pt will increase her FSFI score from 72% to > 80% in order to improve QOL.   Time 12   Period Weeks   Status New   PT LONG TERM GOAL #2   Title Pt will demo increased scar mobility with no report of pain/tenderness with palpation over scar in order to progress to pelvic floor/ deep core strengthening.    Time  12   Period Weeks   Status New   PT LONG TERM GOAL #3   Title Pt will demo no tenderness nor tensions in obturator internus muscles bilaterally in order to improve tolerate sexual intercourse without pain.   Time 12   Period Weeks   Status New               Plan - 09/13/15 1702    Clinical Impression Statement Pt is progressing well with manual Tx, demo'ing no tenderness over scar compared to last session. Pt's remaining areas of increased tenderness/tensions remain at bil obturator internus, and R iliococcygeus. Post -Tx, pt complained of 2/10 pain at R iliococcygeus which decreased to 0/10 with additonal fascial release treatment. Pt will continue benefit from skilled PT.     Pt will benefit from skilled therapeutic intervention in order to improve on the following deficits Decreased strength;Decreased activity tolerance;Decreased endurance;Decreased range of motion;Decreased coordination;Decreased mobility;Decreased scar mobility;Increased muscle spasms;Postural  dysfunction;Increased fascial restricitons;Impaired sensation;Pain;Improper body mechanics;Decreased safety awareness;Decreased knowledge of precautions;Impaired flexibility   Rehab Potential Good   PT Frequency 1x / week   PT Duration 12 weeks   PT Treatment/Interventions ADLs/Self Care Home Management;Biofeedback;Cryotherapy;Electrical Stimulation;Neuromuscular re-education;Balance training;Therapeutic exercise;Manual techniques;Functional mobility training;Patient/family education;Therapeutic activities;Scar mobilization;Passive range of motion;Energy conservation;Taping;Moist Heat;Traction;Gait training   Consulted and Agree with Plan of Care Patient        Problem List Patient Active Problem List   Diagnosis Date Noted  . Vaginal delivery 08/09/2014  . Third degree perineal laceration 08/09/2014    Mariane MastersYeung,Shin Yiing  ,PT, DPT, E-RYT  09/13/2015, 5:05 PM  [place] [facility] [address] [place], [place], [address] Phone: [phone]   Fax:  [phone]  Name: Cynthia Farmer MRN: [id number] Date of Birth: 25-Nov-1985

## 2015-09-14 ENCOUNTER — Ambulatory Visit: Payer: BLUE CROSS/BLUE SHIELD | Admitting: Occupational Therapy

## 2015-09-14 DIAGNOSIS — M25632 Stiffness of left wrist, not elsewhere classified: Secondary | ICD-10-CM

## 2015-09-14 DIAGNOSIS — M25642 Stiffness of left hand, not elsewhere classified: Secondary | ICD-10-CM

## 2015-09-14 DIAGNOSIS — M79631 Pain in right forearm: Secondary | ICD-10-CM

## 2015-09-14 DIAGNOSIS — M6281 Muscle weakness (generalized): Secondary | ICD-10-CM

## 2015-09-14 DIAGNOSIS — M25532 Pain in left wrist: Secondary | ICD-10-CM

## 2015-09-14 NOTE — Therapy (Signed)
[facility] PHYSICAL [facility]ORTS MEDICINE [address] [place], [address] Phone: [phone]   Fax:  [phone]  Occupational Therapy Treatment  Patient Details  Name: Cynthia Farmer MRN: [id number] Date of Birth: 1986-07-10 No Data Recorded  Encounter Date: 09/14/2015      OT End of Session - 09/14/15 0954    Visit Number 11   Number of Visits 12   Date for OT Re-Evaluation 09/16/15   OT Start Time 0902   OT Stop Time 0950   OT Time Calculation (min) 48 min   Activity Tolerance Patient tolerated treatment well   Behavior During Therapy [facility] for tasks assessed/performed      Past Medical History  Diagnosis Date  . Hx of varicella   . Medical history non-contributory     Past Surgical History  Procedure Laterality Date  . Stitches post-3rd degree tear during child birth 2015  2015    There were no vitals filed for this visit.  Visit Diagnosis:  Left wrist pain  Wrist stiffness, left  Muscle weakness  Pain of right forearm  Stiffness of finger joint, left      Subjective Assessment - 09/14/15 0906    Subjective  My R hand don't know if it likes the splint or not - more stiff coming out but pain feels better when wearing - Pain about 4/10 on the R and and still feels the pop at the R thumb  - L feels good - no pain anymore, except that one exercises-  more mobility in thumb and wrist - pain only when thumb gets hit    Patient Stated Goals I want to use my hand like before and the pain better    Currently in Pain? Yes   Pain Score 4    Pain Location Wrist   Pain Orientation Right   Pain Descriptors / Indicators Aching   Pain Type Acute pain            [facility] OT Assessment - 09/14/15 0001    AROM   Right Wrist Extension 72 Degrees   Right Wrist Flexion 95 Degrees   Right Wrist Radial Deviation 17 Degrees   Right Wrist Ulnar Deviation 36 Degrees   Left Wrist Extension 45 Degrees   Left Wrist Flexion 84  Degrees   Left Wrist Radial Deviation 18 Degrees   Left Wrist Ulnar Deviation 28 Degrees   Strength   Right Hand Grip (lbs) 50   Right Hand Lateral Pinch 17 lbs  pain   Right Hand 3 Point Pinch 12 lbs   Left Hand Grip (lbs) 35   Left Hand Lateral Pinch 17 lbs   Left Hand 3 Point Pinch 14 lbs                  OT Treatments/Exercises (OP) - 09/14/15 0001    Wrist Exercises   Other wrist exercises Measurementments taken for bilateral wrist and grip/prehension strength   Other wrist exercises Wrist UD with thumbstatic to cont , prayer stretch - 10 reps    Hand Exercises   Other Hand Exercises De Quervain strength - for thumRA isometric, palm on table and thumb up to ceiling (isometric); thumb RA with IP flexed  8 reps all    Other Hand Exercises pt needed min A to do correct to not have pain    Electrical Stimulation   Electrical Stimulation Location R EPB and extensor mass   Electrical Stimulation Action 12  Electrical Stimulation Parameters acut pain protocol - high volt    Electrical Stimulation Goals Pain   LUE Fluidotherapy   Number Minutes Fluidotherapy 10 Minutes   LUE Fluidotherapy Location Hand;Wrist   Comments increase ROM at UD , wrist extention and thumb wrist composite flexion /UD    Manual Therapy   Manual therapy comments Tender at R  1st dorsal compartment                 OT Education - 09/14/15 0954    Education provided Yes   Education Details HEP, modifications    Person(s) Educated Patient   Methods Explanation;Demonstration;Tactile cues;Verbal cues   Comprehension Verbal cues required;Returned demonstration;Verbalized understanding          OT Short Term Goals - 09/14/15 1000    OT SHORT TERM GOAL #1   Title Pain in L wrist on PRWHE improve by at least 10-15 points    Baseline At eval 22/50; now 9/50   Status Achieved   OT SHORT TERM GOAL #2   Title Pt to be ind in HEP  to increase AROM at wrist and thumb to [facility] AWFL when painfree    Baseline L pain only when hitting thumb - able to do ther ex   Status Achieved   OT SHORT TERM GOAL #3   Title Pt verbalize at  least 4 modifications  and avoidance of provoking postures or activities in daily activities at home, with daughter and work    Baseline  work full time and has 31 year old daughter- trying to implement   Time 4   Status On-going           OT Long Term Goals - 09/14/15 1001    OT LONG TERM GOAL #1   Title Function on PRWHE improve with at least  10 points to return to use hand without brace pain free   Baseline Function on PRHWE 18/50 at eval , now 5/50   Status Achieved   OT LONG TERM GOAL #2   Title Grip strength improve with 10 lbs to return to prior level of function    Baseline Grip strength R55; L 35 lbs ;  - not back to prior level because of pain R wrist                Plan - 09/14/15 0955    Clinical Impression Statement L wrist pain, ROM and grip/prehension improved greatly  - strengthening now being done - but R showed increase few wks ago - was place in prefab thumb spica but she feels it makes her stiff - pt was ed on modifiations and joint protection - especially with  working with her 31 yr old - she had increase pain on the R last weekend - and report tendon popping -  pt refer to see DR Poggie for R - [facility] - but she is only doing 2 feeding now - and want to check with lactations nsg if safe - because her 1st dorsal compartment tendinitis is taking slower to heal than others - pt to see MD Friday and return to me on next Wed    Pt will benefit from skilled therapeutic intervention in order to improve on the following deficits (Retired) Pain;Impaired flexibility;Decreased strength;Decreased range of motion;Decreased coordination;Decreased activity tolerance   Rehab Potential Good   OT Frequency 2x / week   OT Duration 4 weeks   OT  Treatment/Interventions Self-care/ADL  training;Cryotherapy;Moist Heat;Contrast Bath;Fluidtherapy;Iontophoresis;Therapeutic exercise;Manual Therapy;Passive range of motion;Splinting;Patient/family education   Plan how appt with Dr Joice Lofts went - if checked with lactation nsg    OT Home Exercise Plan see pt instruction   Consulted and Agree with Plan of Care Patient        Problem List Patient Active Problem List   Diagnosis Date Noted  . Vaginal delivery 08/09/2014  . Third degree perineal laceration 08/09/2014    Oletta Cohn OTR/L,CLT 09/14/2015, 10:04 AM  [place] [facility] PHYSICAL AND SPORTS MEDICINE [address], [place], [address] Phone: [phone]   Fax:  [phone]  Name: Tammee Thielke MRN: [id number] Date of Birth: 27-Apr-1986

## 2015-09-14 NOTE — Patient Instructions (Signed)
Same HEP but do not do thumb ext with IP flexed so high - had less pain  And light isometric for RA   Thumb spica on R  And soft /no splint on L  Cont with joint protection and modifications bilateral hands

## 2015-09-20 ENCOUNTER — Ambulatory Visit: Payer: BLUE CROSS/BLUE SHIELD | Admitting: Physical Therapy

## 2015-09-20 DIAGNOSIS — R279 Unspecified lack of coordination: Secondary | ICD-10-CM

## 2015-09-20 DIAGNOSIS — M6289 Other specified disorders of muscle: Secondary | ICD-10-CM

## 2015-09-20 DIAGNOSIS — M25532 Pain in left wrist: Secondary | ICD-10-CM | POA: Diagnosis not present

## 2015-09-20 DIAGNOSIS — M629 Disorder of muscle, unspecified: Secondary | ICD-10-CM

## 2015-09-20 NOTE — Therapy (Signed)
[place] [facility] MAIN [facility] SERVICES [address] [place], [place], [address] Phone: [phone]   Fax:  [phone]  Physical Therapy Treatment  Patient Details  Name: Cynthia Farmer MRN: [id number] Date of Birth: 1986-06-12 Referring Provider: Charlotta Newton  Encounter Date: 09/20/2015      PT End of Session - 09/20/15 2324    Visit Number 3   Number of Visits 12   Date for PT Re-Evaluation 11/14/15   PT Start Time 1610   PT Stop Time 1710   PT Time Calculation (min) 60 min   Behavior During Therapy [facility] for tasks assessed/performed      Past Medical History  Diagnosis Date  . Hx of varicella   . Medical history non-contributory     Past Surgical History  Procedure Laterality Date  . Stitches post-3rd degree tear during child birth 2015  2015    There were no vitals filed for this visit.  Visit Diagnosis:  Fascial defect  Lack of coordination  Pelvic floor dysfunction      Subjective Assessment - 09/20/15 1622    Subjective Pt reported the "irritation" sensation on R pelvic floor that occurred at the end of last session had persisted until midday the following day. Since then, it has not been a problem. Pt finds that she gets too tired sometimes to do HEP prior to bed after her mothering duties.             [facility] PT Assessment - 09/20/15 2320    Coordination   Gross Motor Movements are Fluid and Coordinated --  instability w/ Dyn stab ex level 3 (improved  post-Tx)   Posture/Postural Control   Posture Comments improved sitting posture                  Pelvic Floor Special Questions - 09/20/15 2318    Pelvic Floor Internal Exam pt provided verbal consent with no contraindications   Exam Type Vaginal   Palpation very slight tenderness over R obt int, noted restrictions along perineal scar (mid length)   decreased scar restriction post Tx           [facility] Adult PT Treatment/Exercise - 09/20/15 2320    Therapeutic  Activites    Therapeutic Activities --  modification to HEP to ensure compliance & fit mom schedule   Neuro Re-ed    Neuro Re-ed Details  dynamic stablization 3 10 reps   Manual Therapy   Myofascial Release scar release transverse direction                PT Education - 09/20/15 2324    Education provided Yes   Education Details HEP   Person(s) Educated Patient   Methods Explanation;Demonstration;Tactile cues;Verbal cues   Comprehension Returned demonstration;Verbalized understanding             PT Long Term Goals - 09/07/15 2332    PT LONG TERM GOAL #1   Title Pt will increase her FSFI score from 72% to > 80% in order to improve QOL.   Time 12   Period Weeks   Status New   PT LONG TERM GOAL #2   Title Pt will demo increased scar mobility with no report of pain/tenderness with palpation over scar in order to progress to pelvic floor/ deep core strengthening.    Time 12   Period Weeks   Status New   PT LONG TERM GOAL #3   Title Pt will demo no tenderness nor  tensions in obturator internus muscles bilaterally in order to improve tolerate sexual intercourse without pain.   Time 12   Period Weeks   Status New               Plan - 09/20/15 2324    Clinical Impression Statement Pt continues to show decreased perineal scar restrictions and significantly decreased pelvic floor mm tensions. Pt will continue to benefit from skilled PT to advance towards her goals.    Pt will benefit from skilled therapeutic intervention in order to improve on the following deficits Decreased strength;Decreased activity tolerance;Decreased endurance;Decreased range of motion;Decreased coordination;Decreased mobility;Decreased scar mobility;Increased muscle spasms;Postural dysfunction;Increased fascial restricitons;Impaired sensation;Pain;Improper body mechanics;Decreased safety awareness;Decreased knowledge of precautions;Impaired flexibility   Rehab Potential Good   PT Frequency 1x  / week   PT Duration 12 weeks   PT Treatment/Interventions ADLs/Self Care Home Management;Biofeedback;Cryotherapy;Electrical Stimulation;Neuromuscular re-education;Balance training;Therapeutic exercise;Manual techniques;Functional mobility training;Patient/family education;Therapeutic activities;Scar mobilization;Passive range of motion;Energy conservation;Taping;Moist Heat;Traction;Gait training   Consulted and Agree with Plan of Care Patient        Problem List Patient Active Problem List   Diagnosis Date Noted  . Vaginal delivery 08/09/2014  . Third degree perineal laceration 08/09/2014    Mariane MastersYeung,Shin Yiing ,PT, DPT, E-RYT  09/20/2015, 11:27 PM  [place] [facility] [address] [place], [place], [address] Phone: [phone]   Fax:  [phone]  Name: Estell HarpinKristin Mccauslin MRN: [id number] Date of Birth: 08-21-1986

## 2015-09-20 NOTE — Patient Instructions (Signed)
Pelvic floor stretches incorporated into bathing daughter (sitting on stool with butterfly position, feet flat)  And also reading books in half butterfly position or two heels on one side of the hips rocking.

## 2015-09-21 ENCOUNTER — Ambulatory Visit: Payer: BLUE CROSS/BLUE SHIELD | Admitting: Occupational Therapy

## 2015-09-21 DIAGNOSIS — M79631 Pain in right forearm: Secondary | ICD-10-CM

## 2015-09-21 DIAGNOSIS — M25632 Stiffness of left wrist, not elsewhere classified: Secondary | ICD-10-CM

## 2015-09-21 DIAGNOSIS — M6281 Muscle weakness (generalized): Secondary | ICD-10-CM

## 2015-09-21 DIAGNOSIS — M25532 Pain in left wrist: Secondary | ICD-10-CM | POA: Diagnosis not present

## 2015-09-21 NOTE — Therapy (Signed)
[facility] [facility] [address] [place], [place], [address] Phone: [facility]838 [phone]   Fax:  [phone]  Occupational Therapy Treatment  Patient Details  Name: Cynthia HarpinKristin Releford MRN: [id number] Date of Birth: 15-Dec-1985 No Data Recorded  Encounter Date: 09/21/2015      OT End of Session - 09/21/15 1125    Visit Number 12   Number of Visits 18   Date for OT Re-Evaluation 10/12/15   OT Start Time 0903   OT Stop Time 0946   OT Time Calculation (min) 43 min   Activity Tolerance Patient tolerated treatment well   Behavior During Therapy [facility] for tasks assessed/performed      Past Medical History  Diagnosis Date  . Hx of varicella   . Medical history non-contributory     Past Surgical History  Procedure Laterality Date  . Stitches post-3rd degree tear during child birth 2015  2015    There were no vitals filed for this visit.  Visit Diagnosis:  Wrist stiffness, left - Plan: Ot plan of care cert/re-cert  Pain of right forearm - Plan: Ot plan of care cert/re-cert  Muscle weakness - Plan: Ot plan of care cert/re-cert      Subjective Assessment - 09/21/15 1120    Subjective  L hand is good - I can tell it is getting stronger - exericises good - just when I bump thumb maybe once a day - it hurts - I wear the soft splint on R when working with daughter in evening - pain still and tender - did see DR poggi - gave me options and  souunded like  he do not have issues with sterioids - and I talked to the lactation people -  pt want to try ionto on R hand    Patient Stated Goals I want to use my hand like before and the pain better    Currently in Pain? Yes   Pain Score 3    Pain Location Hand   Pain Orientation Right   Pain Descriptors / Indicators Aching                      OT Treatments/Exercises (OP) - 09/21/15 0001    Iontophoresis   Type of Iontophoresis Dexamethasone   Location R 1st dorsal compartment    Dose 1.0 small patch at 0.7 then decrease to .5 current - but after 10 min had to stop pt got "woozy"   Time 10   LUE Fluidotherapy   Number Minutes Fluidotherapy 15 Minutes   LUE Fluidotherapy Location Hand;Wrist   Comments increase ROM at wrist and thumb at same time than ionto                OT Education - 09/21/15 1124    Education provided Yes   Education Details Home program   Person(s) Educated Patient   Methods Demonstration;Explanation   Comprehension Verbalized understanding;Returned demonstration          OT Short Term Goals - 09/21/15 1128    OT SHORT TERM GOAL #1   Title Pain in L wrist on PRWHE improve by at least 10-15 points    Baseline At eval 22/50; now 9/50   Status Achieved   OT SHORT TERM GOAL #2   Title Pt to be ind in HEP  to increase AROM at wrist and thumb to [facility] when pain[facility]   Baseline L pain only when hitting thumb - able to  do ther ex   Status Achieved   OT SHORT TERM GOAL #3   Title Pt verbalize at  least 4 modifications  and avoidance of provoking postures or activities in daily activities at home, with daughter and work    Baseline still pain on the R now - L is better    Status On-going           OT Long Term Goals - 09/21/15 1129    OT LONG TERM GOAL #1   Title Function on PRWHE improve with at least  10 points to return to use hand without brace pain free   Baseline Function on PRHWE 18/50 at eval , now 5/50   Status On-going   OT LONG TERM GOAL #2   Title Grip strength improve with 10 lbs to return to prior level of function    Baseline Grip strength R55; L 35 lbs ;  - not back to prior level because of pain R wrist    Time 3   Period Weeks   Status On-going               Plan - 09/21/15 1126    Clinical Impression Statement While doing ionto this date - pt had hard time with what she felt - and was very fixated on what she felt at hand - she started feeling "woozy"  and light headed - we helped her to lying  down and took vitals - all was good - after 5-10 min startting feeling better - pt do report she  does that any time any medical procedure get done - like at dentist one time and getting flu shot -  will try again next session but try and take her attetiion away  from it - and start with low current     Pt will benefit from skilled therapeutic intervention in order to improve on the following deficits (Retired) Pain;Impaired flexibility;Decreased strength;Decreased range of motion;Decreased coordination;Decreased activity tolerance   Rehab Potential Good   OT Frequency 2x / week   OT Duration 4 weeks   OT Treatment/Interventions Self-care/ADL training;Cryotherapy;Moist Heat;Contrast Bath;Fluidtherapy;Iontophoresis;Therapeutic exercise;Manual Therapy;Passive range of motion;Splinting;Patient/family education   Plan Assess pain , splint wearing ,    OT Home Exercise Plan see pt instruction   Consulted and Agree with Plan of Care Patient        Problem List Patient Active Problem List   Diagnosis Date Noted  . Vaginal delivery 08/09/2014  . Third degree perineal laceration 08/09/2014    Oletta Cohn OTR/L,CLT 09/21/2015, 11:32 AM  [place] [facility] [address] [place], [address] Phone: [phone]   Fax:  [phone]  Name: Aiyanna Awtrey MRN: [id number] Date of Birth: 03-29-1986

## 2015-09-21 NOTE — Patient Instructions (Signed)
Same as before.

## 2015-09-23 ENCOUNTER — Ambulatory Visit: Payer: BLUE CROSS/BLUE SHIELD | Admitting: Occupational Therapy

## 2015-09-23 DIAGNOSIS — M79631 Pain in right forearm: Secondary | ICD-10-CM

## 2015-09-23 DIAGNOSIS — M25532 Pain in left wrist: Secondary | ICD-10-CM | POA: Diagnosis not present

## 2015-09-23 DIAGNOSIS — M6281 Muscle weakness (generalized): Secondary | ICD-10-CM

## 2015-09-23 NOTE — Patient Instructions (Signed)
Same for L  Hand  R neoprene splint -  And still modify activities and joint protection with bilateral hands

## 2015-09-23 NOTE — Therapy (Signed)
[facility] [address] [place], [place], [address] Phone: [phone]   Fax:  [phone]  Occupational Therapy Treatment  Patient Details  Name: Cynthia HarpinKristin Farmer MRN: [id number] Date of Birth: Mar 29, 1986 No Data Recorded  Encounter Date: 09/23/2015      OT End of Session - 09/23/15 1355    Visit Number 13   Number of Visits 18   Date for OT Re-Evaluation 10/12/15   OT Start Time 0914   OT Stop Time 1003   OT Time Calculation (min) 49 min   Activity Tolerance Patient tolerated treatment well   Behavior During Therapy [facility] for tasks assessed/performed      Past Medical History  Diagnosis Date  . Hx of varicella   . Medical history non-contributory     Past Surgical History  Procedure Laterality Date  . Stitches post-3rd degree tear during child birth 2015  2015    There were no vitals filed for this visit.  Visit Diagnosis:  Pain of right forearm  Muscle weakness      Subjective Assessment - 09/23/15 1130    Subjective  I want to try that ionto again - but I want to ly down and if you can talk with me about [employer] - My L hand only bother me if something hit my thumb donw - 1-2x day    Patient Stated Goals I want to use my hand like before and the pain better    Currently in Pain? Yes   Pain Score 3    Pain Location Wrist   Pain Orientation Right   Pain Descriptors / Indicators Aching                      OT Treatments/Exercises (OP) - 09/23/15 0001    Iontophoresis   Type of Iontophoresis Dexamethasone   Location R 1st dorsal compartment   Dose 1.0 small patch at 1.2 current and was able to tolerate with OT talking with her the whole time    Time 6533                OT Education - 09/23/15 1354    Education provided Yes   Education Details homeprogram   Person(s) Educated Patient   Methods Explanation   Comprehension Verbalized understanding          OT Short  Term Goals - 09/21/15 1128    OT SHORT TERM GOAL #1   Title Pain in L wrist on PRWHE improve by at least 10-15 points    Baseline At eval 22/50; now 9/50   Status Achieved   OT SHORT TERM GOAL #2   Title Pt to be ind in HEP  to increase AROM at wrist and thumb to [facility] when painfree   Baseline L pain only when hitting thumb - able to do ther ex   Status Achieved   OT SHORT TERM GOAL #3   Title Pt verbalize at  least 4 modifications  and avoidance of provoking postures or activities in daily activities at home, with daughter and work    Baseline still pain on the R now - L is better    Status On-going           OT Long Term Goals - 09/21/15 1129    OT LONG TERM GOAL #1   Title Function on PRWHE improve with at least  10 points to return to use hand without brace pain  free   Baseline Function on PRHWE 18/50 at eval , now 5/50   Status On-going   OT LONG TERM GOAL #2   Title Grip strength improve with 10 lbs to return to prior level of function    Baseline Grip strength R55; L 35 lbs ;  - not back to prior level because of pain R wrist    Time 3   Period Weeks   Status On-going               Plan - 09/23/15 1356    Clinical Impression Statement Pt this date able to tolerate ionto while lying down and OT had to be present to keep her mind away from getting fixated on patch- need 100% of time presence of OT and verbal support - was able to graduall increase during session to take about 33 min    Pt will benefit from skilled therapeutic intervention in order to improve on the following deficits (Retired) Pain;Impaired flexibility;Decreased strength;Decreased range of motion;Decreased coordination;Decreased activity tolerance   Rehab Potential Good   OT Frequency 2x / week   OT Duration 4 weeks   OT Treatment/Interventions Self-care/ADL training;Cryotherapy;Moist Heat;Contrast Bath;Fluidtherapy;Iontophoresis;Therapeutic exercise;Manual Therapy;Passive range of  motion;Splinting;Patient/family education   Plan assess pain and results after ionto   OT Home Exercise Plan see pt instruction   Consulted and Agree with Plan of Care Patient        Problem List Patient Active Problem List   Diagnosis Date Noted  . Vaginal delivery 08/09/2014  . Third degree perineal laceration 08/09/2014    Oletta Cohn OTR/L,CLT 09/23/2015, 1:59 PM  [facility] [facility] [address] [place], [address] Phone: [phone]   Fax:  [phone]  Name: Cynthia Farmer MRN: [id number] Date of Birth: 11/08/1985

## 2015-09-26 ENCOUNTER — Ambulatory Visit: Payer: BLUE CROSS/BLUE SHIELD | Admitting: Occupational Therapy

## 2015-09-26 DIAGNOSIS — M6281 Muscle weakness (generalized): Secondary | ICD-10-CM

## 2015-09-26 DIAGNOSIS — M79631 Pain in right forearm: Secondary | ICD-10-CM

## 2015-09-26 DIAGNOSIS — M25532 Pain in left wrist: Secondary | ICD-10-CM | POA: Diagnosis not present

## 2015-09-26 DIAGNOSIS — M25632 Stiffness of left wrist, not elsewhere classified: Secondary | ICD-10-CM

## 2015-09-26 NOTE — Patient Instructions (Signed)
Same HEP  

## 2015-09-26 NOTE — Therapy (Signed)
[facility] [facility] [address], [place], [address] Phone: [phone]   Fax:  [phone]  Occupational Therapy Treatment  Patient Details  Name: Cynthia Farmer MRN: [id number] Date of Birth: 1986-03-01 No Data Recorded  Encounter Date: 09/26/2015      OT End of Session - 09/26/15 1523    Visit Number 14   Number of Visits 18   Date for OT Re-Evaluation 10/12/15   OT Start Time 1300   OT Stop Time 1329   OT Time Calculation (min) 29 min      Past Medical History  Diagnosis Date  . Hx of varicella   . Medical history non-contributory     Past Surgical History  Procedure Laterality Date  . Stitches post-3rd degree tear during child birth 2015  2015    There were no vitals filed for this visit.  Visit Diagnosis:  Pain of right forearm  Wrist stiffness, left  Muscle weakness      Subjective Assessment - 09/26/15 1513    Subjective  Pain about same on the R - Soft splint on the R - no splint on L  - working with soft splnt on and off on the R and L no splint - only hurts when htting thumb - ache pain    Patient Stated Goals I want to use my hand like before and the pain better    Currently in Pain? Yes   Pain Score 4    Pain Location Wrist   Pain Orientation Right   Pain Descriptors / Indicators Aching                      OT Treatments/Exercises (OP) - 09/26/15 0001    Iontophoresis   Type of Iontophoresis Dexamethasone   Location R 1st dorsal compartment   Dose 1.0 small patch at 1.0 current and was able to tolerate with OT talking with her the whole time    Time 17                OT Education - 09/26/15 1522    Education provided Yes   Education Details ionto   Person(s) Educated Patient   Methods Explanation   Comprehension Verbalized understanding          OT Short Term Goals - 09/21/15 1128    OT SHORT TERM GOAL #1   Title Pain in L wrist on PRWHE  improve by at least 10-15 points    Baseline At eval 22/50; now 9/50   Status Achieved   OT SHORT TERM GOAL #2   Title Pt to be ind in HEP  to increase AROM at wrist and thumb to [facility] when painfree   Baseline L pain only when hitting thumb - able to do ther ex   Status Achieved   OT SHORT TERM GOAL #3   Title Pt verbalize at  least 4 modifications  and avoidance of provoking postures or activities in daily activities at home, with daughter and work    Baseline still pain on the R now - L is better    Status On-going           OT Long Term Goals - 09/21/15 1129    OT LONG TERM GOAL #1   Title Function on PRWHE improve with at least  10 points to return to use hand without brace pain free   Baseline Function on PRHWE 18/50 at eval ,  now 5/50   Status On-going   OT LONG TERM GOAL #2   Title Grip strength improve with 10 lbs to return to prior level of function    Baseline Grip strength R55; L 35 lbs ;  - not back to prior level because of pain R wrist    Time 3   Period Weeks   Status On-going               Plan - 09/26/15 1524    Clinical Impression Statement Pt cont to have pain at R 1st dorsal compartment 4/10 pain -this was 3 rd session but she did not tolerate 1st one very well - did discuss wearing thumb spica more and wear soft splint when using thumb a lot    Pt will benefit from skilled therapeutic intervention in order to improve on the following deficits (Retired) Pain;Impaired flexibility;Decreased strength;Decreased range of motion;Decreased coordination;Decreased activity tolerance   Rehab Potential Good   OT Frequency 2x / week   OT Duration 4 weeks   OT Treatment/Interventions Self-care/ADL training;Cryotherapy;Moist Heat;Contrast Bath;Fluidtherapy;Iontophoresis;Therapeutic exercise;Manual Therapy;Passive range of motion;Splinting;Patient/family education   Plan assesspain    OT Home Exercise Plan see pt instruction   Consulted and Agree with Plan of Care  Patient        Problem List Patient Active Problem List   Diagnosis Date Noted  . Vaginal delivery 08/09/2014  . Third degree perineal laceration 08/09/2014    Oletta CohnuPreez, [place] Sima OTR/L,CLT 09/26/2015, 3:33 PM   [facility] [address] [place], [place], [address] Phone: [phone]   Fax:  [phone]  Name: Cynthia Farmer MRN: [id number] Date of Birth: 11/24/1985

## 2015-09-27 ENCOUNTER — Ambulatory Visit: Payer: BLUE CROSS/BLUE SHIELD | Admitting: Physical Therapy

## 2015-09-27 DIAGNOSIS — R279 Unspecified lack of coordination: Secondary | ICD-10-CM

## 2015-09-27 DIAGNOSIS — M629 Disorder of muscle, unspecified: Secondary | ICD-10-CM

## 2015-09-27 DIAGNOSIS — M6289 Other specified disorders of muscle: Secondary | ICD-10-CM

## 2015-09-27 DIAGNOSIS — M25532 Pain in left wrist: Secondary | ICD-10-CM | POA: Diagnosis not present

## 2015-09-28 ENCOUNTER — Ambulatory Visit: Payer: BLUE CROSS/BLUE SHIELD | Admitting: Occupational Therapy

## 2015-09-28 DIAGNOSIS — M6281 Muscle weakness (generalized): Secondary | ICD-10-CM

## 2015-09-28 DIAGNOSIS — M25532 Pain in left wrist: Secondary | ICD-10-CM | POA: Diagnosis not present

## 2015-09-28 DIAGNOSIS — M79631 Pain in right forearm: Secondary | ICD-10-CM

## 2015-09-28 NOTE — Patient Instructions (Signed)
Same but do contrast bath over the weekend for L hand and soft splint if pain free in it - or thumb spica

## 2015-09-28 NOTE — Patient Instructions (Signed)
Dynamic stabilization Level 2, 20 reps  Body mechanics and use of sheets to facilitate less pain with intercourse in quadriped position.

## 2015-09-28 NOTE — Therapy (Signed)
[place] [facility] SERVICES [address] [place], [place], [address] Phone: [phone]   Fax:  [phone]  Physical Therapy Treatment  Patient Details  Name: Cynthia Farmer MRN: [id number] Date of Birth: Oct 02, 1986 Referring Provider: Nelda Marseille  Encounter Date: 09/27/2015      PT End of Session - 09/28/15 1126    Visit Number 4   Number of Visits 12   Date for PT Re-Evaluation 11/14/15   PT Start Time 1287   PT Stop Time 8676   PT Time Calculation (min) 60 min   Activity Tolerance Patient tolerated treatment well;No increased pain   Behavior During Therapy [facility] for tasks assessed/performed      Past Medical History  Diagnosis Date  . Hx of varicella   . Medical history non-contributory     Past Surgical History  Procedure Laterality Date  . Stitches post-3rd degree tear during child birth 2015  2015    There were no vitals filed for this visit.  Visit Diagnosis:  Lack of coordination  Pelvic floor dysfunction  Fascial defect      Subjective Assessment - 09/28/15 1123    Subjective Pt reports she is starting to be able to gain sensatio back in the perineum. Pt had not discomfort after last sensation.    Patient Stated Goals pt would like to make any type of movement she would like to and not have it feel painful             [facility] PT Assessment - 09/28/15 1124    Other:   Other/ Comments quadriped with  lumbopelvic stability in childs pose rocking                  Pelvic Floor Special Questions - 09/28/15 1125    Pelvic Floor Internal Exam pt provided verbal consent with no contraindications   Exam Type Vaginal   Palpation very slight tenderness over R obt int, noted restrictions along at 6 o clock w/ deeper palpation (mid length), noted decrease scar immobility    decreased tenderness and tensions post Tx           [facility] Adult PT Treatment/Exercise - 09/28/15 1125    Therapeutic Activites    ADL's modification to quadriped position with sheets and pelvic flloor contraction to minimize pain with insertion during sexual intercourse   Neuro Re-ed    Neuro Re-ed Details  dynamic stabilization 3 , 10 reps, reviewed level 2, 10 reps  cues to correct lumbopelvic instability   Manual Therapy   Myofascial Release sustained pressure to release mm tensions at posterior mm near scar.                 PT Education - 09/28/15 1123    Education provided Yes   Education Details HEP, compliance to HEP, body mechanics to sexual positions   Person(s) Educated Patient   Methods Explanation;Demonstration;Verbal cues;Tactile cues   Comprehension Verbalized understanding;Returned demonstration             PT Long Term Goals - 09/27/15 1646    PT LONG TERM GOAL #1   Title (p) Pt will increase her FSFI score from 72% to > 80% in order to improve QOL.   Time (p) 12   Period (p) Weeks   Status (p) On-going   PT LONG TERM GOAL #2   Title (p) Pt will demo increased scar mobility with no report of pain/tenderness with palpation over scar in order to  progress to pelvic floor/ deep core strengthening.    Time (p) 12   Period (p) Weeks   Status (p) Partially Met   PT LONG TERM GOAL #3   Title (p) Pt will demo no tenderness nor tensions in obturator internus muscles bilaterally in order to improve tolerate sexual intercourse without pain.   Time (p) 12   Period (p) Weeks   Status (p) Partially Met               Plan - 09/28/15 1127    Clinical Impression Statement Pt demo'd signifcaintly decreased perineal scar restrictions and decreased sensitivity to decreased regions of pelvic floor. Pt had miinmal area of mm tensions/ tenderness below puborectalis at 6 oclock with deeper palpation which decreased post-Tx. Initiated modfications to sexual position and emphasized use of breathing techniques to faciliate more pelvic floor control  in order to decrease pain with insertion.  Progressed to  deep core stabilization level 2 today.     Pt will benefit from skilled therapeutic intervention in order to improve on the following deficits Decreased strength;Decreased activity tolerance;Decreased endurance;Decreased range of motion;Decreased coordination;Decreased mobility;Decreased scar mobility;Increased muscle spasms;Postural dysfunction;Increased fascial restricitons;Impaired sensation;Pain;Improper body mechanics;Decreased safety awareness;Decreased knowledge of precautions;Impaired flexibility   Rehab Potential Good   PT Frequency 1x / week   PT Duration 12 weeks   PT Treatment/Interventions ADLs/Self Care Home Management;Biofeedback;Cryotherapy;Electrical Stimulation;Neuromuscular re-education;Balance training;Therapeutic exercise;Manual techniques;Functional mobility training;Patient/family education;Therapeutic activities;Scar mobilization;Passive range of motion;Energy conservation;Taping;Moist Heat;Traction;Gait training   Consulted and Agree with Plan of Care Patient        Problem List Patient Active Problem List   Diagnosis Date Noted  . Vaginal delivery 08/09/2014  . Third degree perineal laceration 08/09/2014    Jerl Mina  ,PT, DPT, E-RYT  09/28/2015, 2:37 PM  [place] [facility] SERVICES [address] [place], [place], [address] Phone: [phone]   Fax:  [phone]  Name: Luvena Wentling MRN: [id number] Date of Birth: 08-26-1986

## 2015-09-28 NOTE — Therapy (Signed)
[place] [facility]INE [address] [place], [address] Phone: [phone]   Fax:  [phone]  Occupational Therapy Treatment  Patient Details  Name: Ellianah Cordy MRN: [id number] Date of Birth: [birth year] No Data Recorded  Encounter Date: 09/28/2015      OT End of Session - 09/28/15 0928    Visit Number 15   Number of Visits 18   Date for OT Re-Evaluation 10/12/15   OT Start Time 0901   OT Stop Time 0935   OT Time Calculation (min) 34 min   Activity Tolerance Patient tolerated treatment well   Behavior During Therapy [facility] for tasks assessed/performed      Past Medical History  Diagnosis Date  . Hx of varicella   . Medical history non-contributory     Past Surgical History  Procedure Laterality Date  . Stitches post-3rd degree tear during child birth 2015  2015    There were no vitals filed for this visit.  Visit Diagnosis:  Pain of right forearm  Muscle weakness      Subjective Assessment - 09/28/15 0912    Subjective  Pain is about 4/10 on the R - wearing thumb spica but then my wrist and thumb really hurts when getting out of splint - it is not as tender today    Patient Stated Goals I want to use my hand like before and the pain better    Currently in Pain? No/denies   Pain Score 4    Pain Location Wrist   Pain Orientation Right   Pain Descriptors / Indicators Aching                      OT Treatments/Exercises (OP) - 09/28/15 0001    Iontophoresis   Type of Iontophoresis Dexamethasone   Location R 1st dorsal compartment   Dose 1.0 small patch at 1.8 current and was able to tolerate with OT talking with her the whole time    Time 28                OT Education - 09/28/15 0928    Education provided Yes   Education Details HEP and Ionto   Person(s) Educated Patient   Methods Explanation;Demonstration   Comprehension Verbalized understanding;Returned  demonstration          OT Short Term Goals - 09/28/15 0933    OT SHORT TERM GOAL #1   Title Pain in L wrist on PRWHE improve by at least 10-15 points    Baseline At eval 22/50; now 9/50   Status Achieved   OT SHORT TERM GOAL #2   Title Pt to be ind in HEP  to increase AROM at wrist and thumb to [facility] when painfree   Status Achieved   OT SHORT TERM GOAL #3   Title Pt verbalize at  least 4 modifications  and avoidance of provoking postures or activities in daily activities at home, with daughter and work    Baseline still pain on the R now - L is better    Time 3   Status On-going           OT Long Term Goals - 09/28/15 0934    OT LONG TERM GOAL #1   Title Function on PRWHE improve with at least  10 points to return to use hand without brace pain free   Baseline Function on PRHWE 18/50 at eval , now 5/50   Time  3   Period Weeks   Status On-going   OT LONG TERM GOAL #2   Title Grip strength improve with 10 lbs to return to prior level of function    Baseline Grip strength R55; L 35 lbs ;  - not back to prior level because of pain R wrist    Time 3   Period Weeks   Status On-going               Plan - 09/28/15 0931    Clinical Impression Statement Pt cont to have pain on the R wrist - 1st dorsal compartment - pt to to wear splints and do more  contrast and using joint protection - this was her 4th session - but first was not very effective because pt could not tolerate ionto - after that  OT needs to sit with her - gradually  increaes current and talk wit hher to keep her calm to tolerate     Pt will benefit from skilled therapeutic intervention in order to improve on the following deficits (Retired) Pain;Impaired flexibility;Decreased strength;Decreased range of motion;Decreased coordination;Decreased activity tolerance   OT Duration 4 weeks   OT Treatment/Interventions Self-care/ADL training;Cryotherapy;Moist Heat;Contrast Bath;Fluidtherapy;Iontophoresis;Therapeutic  exercise;Manual Therapy;Passive range of motion;Splinting;Patient/family education   Plan  assess pain and splint wearing   OT Home Exercise Plan see pt instruction   Consulted and Agree with Plan of Care Patient        Problem List Patient Active Problem List   Diagnosis Date Noted  . Vaginal delivery 08/09/2014  . Third degree perineal laceration 08/09/2014    Oletta CohnuPreez, Camari Wisham OTR/L,CLT 09/28/2015, 9:36 AM  [place] [address] [facility] [address] [place], [place], [address] Phone: [phone]   Fax:  [phone]  Name: Estell HarpinKristin Drakeford MRN: [id number] Date of Birth: Apr 09, 1986

## 2015-10-03 ENCOUNTER — Ambulatory Visit: Payer: BLUE CROSS/BLUE SHIELD | Admitting: Occupational Therapy

## 2015-10-03 DIAGNOSIS — M79631 Pain in right forearm: Secondary | ICD-10-CM

## 2015-10-03 DIAGNOSIS — M25532 Pain in left wrist: Secondary | ICD-10-CM | POA: Diagnosis not present

## 2015-10-03 DIAGNOSIS — M25632 Stiffness of left wrist, not elsewhere classified: Secondary | ICD-10-CM

## 2015-10-03 NOTE — Patient Instructions (Signed)
Thumb spica on for R hand - AROM wrist flexion/extention , opposition 2 x day

## 2015-10-03 NOTE — Therapy (Signed)
[place] [facility] [address] [place], [place], [address] Phone: [phone]   Fax:  [phone]  Occupational Therapy Treatment  Patient Details  Name: Cynthia HarpinKristin Dansereau MRN: [id number] Date of Birth: Nov 20, 1985 No Data Recorded  Encounter Date: 10/03/2015      OT End of Session - 10/03/15 1104    Visit Number 16   Number of Visits 18   Date for OT Re-Evaluation 10/12/15   OT Start Time 1030   OT Stop Time 1115   OT Time Calculation (min) 45 min   Activity Tolerance Patient tolerated treatment well   Behavior During Therapy [facility] for tasks assessed/performed      Past Medical History  Diagnosis Date  . Hx of varicella   . Medical history non-contributory     Past Surgical History  Procedure Laterality Date  . Stitches post-3rd degree tear during child birth 2015  2015    There were no vitals filed for this visit.  Visit Diagnosis:  Pain of right forearm  Wrist stiffness, left      Subjective Assessment - 10/03/15 1045    Subjective  I tried some hot and cold but it felt that it made it worse - like burning pain - so I stopped it and tried again on Sat but still - but I think few things bother my thumb - like suction my daughter's nose - I did wear since yesterday my hard splint    Patient Stated Goals I want to use my hand like before and the pain better    Currently in Pain? Yes   Pain Score 4    Pain Location Wrist   Pain Orientation Right   Pain Descriptors / Indicators Aching                      OT Treatments/Exercises (OP) - 10/03/15 0001    ADLs   ADL Comments Review again joint protection during care of daugher and  work    Wrist Exercises   Other wrist exercises Wrist extention PROM to L hand - can take of splint on ther R AROM wrist flexion/extention/ opposition in all Range but pain free    Iontophoresis   Type of Iontophoresis Dexamethasone   Location R 1st dorsal  compartment   Dose 1.0 small patch at 1.8 current and was able to tolerate with OT talking with her the whole time    Time 28   Splinting   Splinting Wear thumb spica on R hand most all thetime - 2 x off for ROM                 OT Education - 10/03/15 1103    Education provided Yes   Education Details HEP   Person(s) Educated Patient   Methods Explanation;Demonstration   Comprehension Verbalized understanding          OT Short Term Goals - 09/28/15 0933    OT SHORT TERM GOAL #1   Title Pain in L wrist on PRWHE improve by at least 10-15 points    Baseline At eval 22/50; now 9/50   Status Achieved   OT SHORT TERM GOAL #2   Title Pt to be ind in HEP  to increase AROM at wrist and thumb to [facility] when painfree   Status Achieved   OT SHORT TERM GOAL #3   Title Pt verbalize at  least 4 modifications  and avoidance of provoking postures  or activities in daily activities at home, with daughter and work    Baseline still pain on the R now - L is better    Time 3   Status On-going           OT Long Term Goals - 09/28/15 0934    OT LONG TERM GOAL #1   Title Function on PRWHE improve with at least  10 points to return to use hand without brace pain free   Baseline Function on PRHWE 18/50 at eval , now 5/50   Time 3   Period Weeks   Status On-going   OT LONG TERM GOAL #2   Title Grip strength improve with 10 lbs to return to prior level of function    Baseline Grip strength R55; L 35 lbs ;  - not back to prior level because of pain R wrist    Time 3   Period Weeks   Status On-going               Plan - 10/03/15 1106    Clinical Impression Statement Pt still cont to have pain but did appear when she did  use thumb spica the last 24hrs pain did  decrease - did discuss with pt to do immobilzattion to R and use  joint protection with use of L with daugher  - ionto now 5 sessions but first 2 was not very effective with pt not tolerating to good    Pt will benefit  from skilled therapeutic intervention in order to improve on the following deficits (Retired) Pain;Impaired flexibility;Decreased strength;Decreased range of motion;Decreased coordination;Decreased activity tolerance   Rehab Potential Good   OT Frequency 2x / week   OT Duration 4 weeks   OT Treatment/Interventions Self-care/ADL training;Cryotherapy;Moist Heat;Contrast Bath;Fluidtherapy;Iontophoresis;Therapeutic exercise;Manual Therapy;Passive range of motion;Splinting;Patient/family education   Plan assess pain , use of splint    OT Home Exercise Plan see pt instruction   Consulted and Agree with Plan of Care Patient        Problem List Patient Active Problem List   Diagnosis Date Noted  . Vaginal delivery 08/09/2014  . Third degree perineal laceration 08/09/2014    Oletta Cohn OTR/l,CLT 10/03/2015, 11:18 AM  [place] [facility] PHYSICAL AND SPORTS MEDICINE [address] [place], [address] Phone: [phone]   Fax:  [phone]  Name: Cynthia Farmer MRN: [id number] Date of Birth: [birth year]

## 2015-10-04 ENCOUNTER — Ambulatory Visit: Payer: BLUE CROSS/BLUE SHIELD | Admitting: Physical Therapy

## 2015-10-04 DIAGNOSIS — R279 Unspecified lack of coordination: Secondary | ICD-10-CM

## 2015-10-04 DIAGNOSIS — M6289 Other specified disorders of muscle: Secondary | ICD-10-CM

## 2015-10-04 DIAGNOSIS — M25532 Pain in left wrist: Secondary | ICD-10-CM | POA: Diagnosis not present

## 2015-10-04 DIAGNOSIS — M629 Disorder of muscle, unspecified: Secondary | ICD-10-CM

## 2015-10-05 NOTE — Patient Instructions (Signed)
Pelvic tilts  

## 2015-10-05 NOTE — Therapy (Signed)
[place] [facility] SERVICES [address] [place], [place], [address] Phone: [phone]   Fax:  [phone]  Physical Therapy Treatment  Patient Details  Name: Charlaine Utsey MRN: [id number] Date of Birth: 03/09/1986 Referring Provider: Nelda Marseille  Encounter Date: 10/04/2015      PT End of Session - 10/05/15 1500    Visit Number 5   Number of Visits 12   Date for PT Re-Evaluation 11/14/15   PT Start Time 5625   PT Stop Time 1710   PT Time Calculation (min) 55 min   Activity Tolerance Patient tolerated treatment well;No increased pain   Behavior During Therapy [facility] for tasks assessed/performed      Past Medical History  Diagnosis Date  . Hx of varicella   . Medical history non-contributory     Past Surgical History  Procedure Laterality Date  . Stitches post-3rd degree tear during child birth 2015  2015    There were no vitals filed for this visit.  Visit Diagnosis:  Lack of coordination  Pelvic floor dysfunction  Fascial defect      Subjective Assessment - 10/04/15 1622    Subjective Pt reported no pain with sexual intercourse in quadriped position which originally had been most painful at [facility]. Pt reports she feels she has regained some sensation but there remains some numbness in her pelvic floor.                        Pelvic Floor Special Questions - 10/05/15 1445    Pelvic Floor Internal Exam pt provided verbal consent with no contraindications   Exam Type Vaginal   Palpation decreased tenderness and tensions in 1-2 nd layers. Noted scar imobility near R coccyx with tenderness. (decreased tenderness post_Tx)   tenderness/ tensions at ischio/ iliococcygeus R           [facility] Adult PT Treatment/Exercise - 10/05/15 1457    Self-Care   Self-Care --  progression of POC   Therapeutic Activites    ADL's performed correctly with shoulder ext, propped on elbows maneuver to simulate positional changes in  sidelying to quadriped position   Manual Therapy   Myofascial Release scar massage along deep pelvic floor (R coccyx region)   MWM w/ pelvic tilts                     PT Long Term Goals - 10/05/15 1502    PT LONG TERM GOAL #1   Title Pt will increase her FSFI score from 72% to > 80% in order to improve QOL.   Time 12   Period Weeks   Status On-going   PT LONG TERM GOAL #2   Title Pt will demo increased scar mobility with no report of pain/tenderness with palpation over scar in order to progress to pelvic floor/ deep core strengthening.    Time 12   Period Weeks   Status Partially Met   PT LONG TERM GOAL #3   Title Pt will demo no tenderness nor tensions in obturator internus muscles bilaterally in order to improve tolerate sexual intercourse without pain.   Time 12   Period Weeks   Status Partially Met               Plan - 10/04/15 1625    Clinical Impression Statement Pt is progressing well and will continue to benefit from manual releases for perineal scar located at the deep, posterior pelvic floor.  Pt will benefit from skilled therapeutic intervention in order to improve on the following deficits Decreased strength;Decreased activity tolerance;Decreased endurance;Decreased range of motion;Decreased coordination;Decreased mobility;Decreased scar mobility;Increased muscle spasms;Postural dysfunction;Increased fascial restricitons;Impaired sensation;Pain;Improper body mechanics;Decreased safety awareness;Decreased knowledge of precautions;Impaired flexibility   Rehab Potential Good   PT Frequency 1x / week   PT Duration 12 weeks   PT Treatment/Interventions ADLs/Self Care Home Management;Biofeedback;Cryotherapy;Electrical Stimulation;Neuromuscular re-education;Balance training;Therapeutic exercise;Manual techniques;Functional mobility training;Patient/family education;Therapeutic activities;Scar mobilization;Passive range of motion;Energy  conservation;Taping;Moist Heat;Traction;Gait training   Consulted and Agree with Plan of Care Patient        Problem List Patient Active Problem List   Diagnosis Date Noted  . Vaginal delivery 08/09/2014  . Third degree perineal laceration 08/09/2014    Jerl Mina ,PT, DPT, E-RYT  10/05/2015, 3:04 PM  [place] [facility] SERVICES [address] [place], [place], [address] Phone: [phone]   Fax:  [phone]  Name: Yanett Conkright MRN: [id number] Date of Birth: 05/08/1986

## 2015-10-06 ENCOUNTER — Ambulatory Visit: Payer: BLUE CROSS/BLUE SHIELD | Admitting: Occupational Therapy

## 2015-10-10 ENCOUNTER — Ambulatory Visit: Payer: BLUE CROSS/BLUE SHIELD | Attending: Surgery | Admitting: Occupational Therapy

## 2015-10-10 DIAGNOSIS — N8184 Pelvic muscle wasting: Secondary | ICD-10-CM | POA: Insufficient documentation

## 2015-10-10 DIAGNOSIS — M6281 Muscle weakness (generalized): Secondary | ICD-10-CM | POA: Diagnosis present

## 2015-10-10 DIAGNOSIS — M79631 Pain in right forearm: Secondary | ICD-10-CM | POA: Insufficient documentation

## 2015-10-10 DIAGNOSIS — R279 Unspecified lack of coordination: Secondary | ICD-10-CM | POA: Insufficient documentation

## 2015-10-10 DIAGNOSIS — M25532 Pain in left wrist: Secondary | ICD-10-CM | POA: Insufficient documentation

## 2015-10-10 DIAGNOSIS — M25531 Pain in right wrist: Secondary | ICD-10-CM | POA: Insufficient documentation

## 2015-10-10 DIAGNOSIS — M79642 Pain in left hand: Secondary | ICD-10-CM | POA: Diagnosis present

## 2015-10-10 DIAGNOSIS — M629 Disorder of muscle, unspecified: Secondary | ICD-10-CM | POA: Diagnosis present

## 2015-10-10 DIAGNOSIS — M25641 Stiffness of right hand, not elsewhere classified: Secondary | ICD-10-CM | POA: Diagnosis present

## 2015-10-10 NOTE — Patient Instructions (Signed)
Cont with prefab thumb spica on R - and use joint protection /modiifcation of tasks

## 2015-10-10 NOTE — Therapy (Signed)
[place] [facility] [address] [place], [place], [address] Phone: [phone]   Fax:  [phone]  Occupational Therapy Treatment  Patient Details  Name: Cynthia Farmer MRN: [id number] Date of Birth: [birth year] No Data Recorded  Encounter Date: 10/10/2015      OT End of Session - 10/10/15 0946    Visit Number 17   Number of Visits 18   Date for OT Re-Evaluation 10/12/15   OT Start Time 0900   OT Stop Time 0935   OT Time Calculation (min) 35 min   Activity Tolerance Patient tolerated treatment well   Behavior During Therapy [facility] for tasks assessed/performed      Past Medical History  Diagnosis Date  . Hx of varicella   . Medical history non-contributory     Past Surgical History  Procedure Laterality Date  . Stitches post-3rd degree tear during child birth 2015  2015    There were no vitals filed for this visit.  Visit Diagnosis:  Pain of right forearm  Stiffness of finger joint of right hand      Subjective Assessment - 10/10/15 0901    Subjective  L hand is doing good - pain better even when bump it but did get sore when using it a lot washing dishes - R getting better and using splint about 75% to 90% of time - not as tender    Patient Stated Goals I want to use my hand like before and the pain better    Currently in Pain? Yes   Pain Score 2    Pain Location Wrist   Pain Orientation Right   Pain Descriptors / Indicators Aching                      OT Treatments/Exercises (OP) - 10/10/15 0001    Wrist Exercises   Other wrist exercises Unable to initiated AROM at R thumb or wrist - pt to do only in shower piain free range  - do have stiffness but need immobilization at this stage    Iontophoresis   Type of Iontophoresis Dexamethasone   Location R 1st dorsal compartment   Dose 1.0 small patch at 1.8 current and was able to tolerate with OT talking with her the whole time    Time 21   Splinting   Splinting Cont with thumb spica - prefab on R hand    Manual Therapy   Myofascial Release Pt did not had as much tendereness this date                 OT Education - 10/10/15 0946    Education provided Yes   Education Details HEP   Person(s) Educated Patient   Methods Explanation;Demonstration;Tactile cues;Verbal cues   Comprehension Returned demonstration;Verbalized understanding          OT Short Term Goals - 09/28/15 0933    OT SHORT TERM GOAL #1   Title Pain in L wrist on PRWHE improve by at least 10-15 points    Baseline At eval 22/50; now 9/50   Status Achieved   OT SHORT TERM GOAL #2   Title Pt to be ind in HEP  to increase AROM at wrist and thumb to [facility] when painfree   Status Achieved   OT SHORT TERM GOAL #3   Title Pt verbalize at  least 4 modifications  and avoidance of provoking postures or activities in daily activities at home, with daughter  and work    Baseline still pain on the R now - L is better    Time 3   Status On-going           OT Long Term Goals - 09/28/15 0934    OT LONG TERM GOAL #1   Title Function on PRWHE improve with at least  10 points to return to use hand without brace pain free   Baseline Function on PRHWE 18/50 at eval , now 5/50   Time 3   Period Weeks   Status On-going   OT LONG TERM GOAL #2   Title Grip strength improve with 10 lbs to return to prior level of function    Baseline Grip strength R55; L 35 lbs ;  - not back to prior level because of pain R wrist    Time 3   Period Weeks   Status On-going               Plan - 10/10/15 0957    Clinical Impression Statement Pt showed decrease pain this date at R 1 st dorsal compartment - still wearing thumb spica most of the time - L hand and wrist better every week - do not treat L any more - do have stiffness at R but if pain free can increase flexibility    Pt will benefit from skilled therapeutic intervention in order to improve on the following  deficits (Retired) Pain;Impaired flexibility;Decreased strength;Decreased range of motion;Decreased coordination;Decreased activity tolerance   Rehab Potential Good   OT Frequency 2x / week   OT Duration 4 weeks   OT Treatment/Interventions Self-care/ADL training;Cryotherapy;Moist Heat;Contrast Bath;Fluidtherapy;Iontophoresis;Therapeutic exercise;Manual Therapy;Passive range of motion;Splinting;Patient/family education   Plan decrease pain, and if using splint    OT Home Exercise Plan see pt instruction   Consulted and Agree with Plan of Care Patient        Problem List Patient Active Problem List   Diagnosis Date Noted  . Vaginal delivery 08/09/2014  . Third degree perineal laceration 08/09/2014    Oletta Cohn OTR/L,CLT 10/10/2015, 10:01 AM   [facility] PHYSICAL AND SPORTS MEDICINE [address] [place], [address] Phone: [phone]   Fax:  [phone]  Name: Cynthia Farmer MRN: [id number] Date of Birth: Dec 20, 1985

## 2015-10-11 ENCOUNTER — Ambulatory Visit: Payer: BLUE CROSS/BLUE SHIELD | Admitting: Physical Therapy

## 2015-10-11 DIAGNOSIS — M629 Disorder of muscle, unspecified: Secondary | ICD-10-CM

## 2015-10-11 DIAGNOSIS — R279 Unspecified lack of coordination: Secondary | ICD-10-CM

## 2015-10-11 DIAGNOSIS — M6289 Other specified disorders of muscle: Secondary | ICD-10-CM

## 2015-10-11 DIAGNOSIS — M79631 Pain in right forearm: Secondary | ICD-10-CM | POA: Diagnosis not present

## 2015-10-12 ENCOUNTER — Ambulatory Visit: Payer: BLUE CROSS/BLUE SHIELD | Admitting: Occupational Therapy

## 2015-10-12 DIAGNOSIS — M25641 Stiffness of right hand, not elsewhere classified: Secondary | ICD-10-CM

## 2015-10-12 DIAGNOSIS — M79631 Pain in right forearm: Secondary | ICD-10-CM | POA: Diagnosis not present

## 2015-10-12 NOTE — Therapy (Signed)
[place] [facility] MAIN [facility] SERVICES [address] [place], [place], [address] Phone: [phone]   Fax:  [phone]  Physical Therapy Treatment  Patient Details  Name: Cynthia Farmer MRN: [id number] Date of Birth: February 03, 1986 Referring Provider: Charlotta Newton  Encounter Date: 10/11/2015      PT End of Session - 10/12/15 2159    Visit Number 6   Number of Visits 12   Date for PT Re-Evaluation 11/14/15   PT Start Time 1610   PT Stop Time 1645   PT Time Calculation (min) 35 min   Behavior During Therapy [facility] for tasks assessed/performed      Past Medical History  Diagnosis Date  . Hx of varicella   . Medical history non-contributory     Past Surgical History  Procedure Laterality Date  . Stitches post-3rd degree tear during child birth 2015  2015    There were no vitals filed for this visit.  Visit Diagnosis:  Fascial defect  Pelvic floor dysfunction  Lack of coordination      Subjective Assessment - 10/11/15 1611    Subjective Pt reported slight bleeding after last session that was brief. Pt contact Dr. Charlotta Newton and she suspects the possibility of the bleeding coming from contact with the cervix and she recommended monitoring while continuing with PT. Pt also reported she might be getting her menstrual cycle as she feels cramps and is weaning off of breastfeeding.    Pertinent History Hx of 1st pregnancy: difficulty with walking long distances in the last weeks, 10 hours of labor, 7 lb, 9.5 oz. Hobbies: dance, ballroom dancing    Patient Stated Goals pt would like to make any type of movement she would like to and not have it feel painful                       Pelvic Floor Special Questions - 10/12/15 2154    Pelvic Floor Internal Exam pt provided verbal consent with no contraindications   Exam Type Vaginal   Palpation no tenderness nor tensions noted   Strength # of reps 6   Strength # of seconds 1                    PT Education - 10/12/15 2159    Education provided Yes   Education Details HEP   Person(s) Educated Patient   Methods Explanation;Demonstration;Tactile cues;Verbal cues   Comprehension Verbalized understanding;Returned demonstration             PT Long Term Goals - 10/12/15 2205    PT LONG TERM GOAL #1   Title Pt will increase her FSFI score from 72% to > 80% in order to improve QOL.   Time 12   Period Weeks   Status On-going   PT LONG TERM GOAL #2   Title Pt will demo increased scar mobility with no report of pain/tenderness with palpation over scar in order to progress to pelvic floor/ deep core strengthening.    Time 12   Period Weeks   Status Achieved   PT LONG TERM GOAL #3   Title Pt will demo no tenderness nor tensions in obturator internus muscles bilaterally in order to improve tolerate sexual intercourse without pain.   Time 12   Period Weeks   Status Achieved               Plan - 10/12/15 2200    Clinical Impression Statement Pt has achieved  2/3 goals. Pt demo'd no pelvic floor tensions, tenderness, and significantly improved perineal scar mobility. Pt progressed to quick contractions training today. Plan to continue to progress deep core strengthening at next session.    Pt will benefit from skilled therapeutic intervention in order to improve on the following deficits Decreased strength;Decreased activity tolerance;Decreased endurance;Decreased range of motion;Decreased coordination;Decreased mobility;Decreased scar mobility;Increased muscle spasms;Postural dysfunction;Increased fascial restricitons;Impaired sensation;Pain;Improper body mechanics;Decreased safety awareness;Decreased knowledge of precautions;Impaired flexibility   Rehab Potential Good   PT Frequency 1x / week   PT Duration 12 weeks   PT Treatment/Interventions ADLs/Self Care Home Management;Biofeedback;Cryotherapy;Electrical Stimulation;Neuromuscular re-education;Balance  training;Therapeutic exercise;Manual techniques;Functional mobility training;Patient/family education;Therapeutic activities;Scar mobilization;Passive range of motion;Energy conservation;Taping;Moist Heat;Traction;Gait training   Consulted and Agree with Plan of Care Patient        Problem List Patient Active Problem List   Diagnosis Date Noted  . Vaginal delivery 08/09/2014  . Third degree perineal laceration 08/09/2014    Mariane MastersYeung,Shin Yiing ,PT, DPT, E-RYT  10/12/2015, 10:05 PM  [place] [facility] [address] [place], [place], [address] Phone: [phone]   Fax:  [phone]  Name: Cynthia Farmer MRN: [id number] Date of Birth: 1986-09-04

## 2015-10-12 NOTE — Patient Instructions (Signed)
Pelvic floor quick contractions following exhalation 6 reps

## 2015-10-12 NOTE — Patient Instructions (Signed)
Thumb spica on R   L prayer stretch  And can cont with PA and RA isometric   Cont with modifications at work and taking care of 31 yr old daughter

## 2015-10-12 NOTE — Therapy (Signed)
[place] [facility] PHYSICAL AND SPORTS MEDICINE [address] [place], [place], [address] Phone: [phone]   Fax:  [phone]  Occupational Therapy Treatment  Patient Details  Name: Cynthia HarpinKristin Farmer MRN: [id number] Date of Birth: 11-15-1985 No Data Recorded  Encounter Date: 10/12/2015      OT End of Session - 10/12/15 1058    Visit Number 18   Number of Visits 22   Date for OT Re-Evaluation 11/16/15   OT Start Time 0901   OT Stop Time 0940   OT Time Calculation (min) 39 min   Activity Tolerance Patient tolerated treatment well   Behavior During Therapy [facility] for tasks assessed/performed      Past Medical History  Diagnosis Date  . Hx of varicella   . Medical history non-contributory     Past Surgical History  Procedure Laterality Date  . Stitches post-3rd degree tear during child birth 2015  2015    There were no vitals filed for this visit.  Visit Diagnosis:  Stiffness of finger joint of right hand - Plan: Ot plan of care cert/re-cert  Pain of right forearm - Plan: Ot plan of care cert/re-cert      Subjective Assessment - 10/12/15 1052    Subjective  L thumb still little pull at thumb when grasping thumb in palm and make fist - R thumb no pain in splint and tenderness so much better  - just stiff and tight - wearing splint most all the time    Patient Stated Goals I want to use my hand like before and the pain better                       OT Treatments/Exercises (OP) - 10/12/15 0001    Wrist Exercises   Other wrist exercises No tenderness - with Lourena SimmondsFinkelstein felt pull with thumb outside - could be more tightness/stiffness,    Hand Exercises   Other Hand Exercises L only little discomfort between MP and IP if Finkelstein  with thumb inside of hand - but with pressure - pt to cont with isometric for PA and RA of thumb - keep flexibility    Iontophoresis   Type of Iontophoresis Dexamethasone   Location R 1st dorsal  compartment   Dose 1.0 small patch at 1.8 current and was able to tolerate with OT talking with her the whole time    Time 21                OT Education - 10/12/15 1057    Education provided Yes   Education Details HEP   Person(s) Educated Patient   Methods Explanation;Demonstration;Tactile cues   Comprehension Returned demonstration;Verbalized understanding;Verbal cues required          OT Short Term Goals - 10/12/15 1101    OT SHORT TERM GOAL #1   Title Pain in L wrist on PRWHE improve by at least 10-15 points    Status Achieved   OT SHORT TERM GOAL #2   Title Pt to be ind in HEP  to increase AROM at wrist and thumb to [facility] when pain[facility]   Baseline FOr L wrist and hand   Status Achieved   OT SHORT TERM GOAL #3   Title Pt verbalize at  least 4 modifications  and avoidance of provoking postures or activities in daily activities at home, with daughter and work    Status Achieved  OT Long Term Goals - 10/12/15 1102    OT LONG TERM GOAL #1   Title Function on PRWHE improve with at least  10 points to return to use hand without brace pain free   Baseline For L hand    Status Achieved   OT LONG TERM GOAL #2   Title Grip strength improve with 10 lbs to return to prior level of function    Baseline Grip strength R55; L 35 lbs ;  - not back to prior level because of pain R wrist    Status On-going   OT LONG TERM GOAL #3   Title AROM at R wrist and thumb increase to WNL without pain to wean out fo splint    Baseline Pain decrease this date and no tenderness with thumb spica adn ionto - but start ROM next week    Time 4   Period Weeks   Status New               Plan - 10/12/15 1059    Clinical Impression Statement Pt showed great progress  with pain at R 1st dorsal compartment with ionto use - pt to cont with thumb spica - and will assess next week in fluido if more stiffness now - and do maybe 2 more sessions  of ionto - today was her 7th session of  ionto    Pt will benefit from skilled therapeutic intervention in order to improve on the following deficits (Retired) Pain;Impaired flexibility;Decreased strength;Decreased range of motion;Decreased coordination;Decreased activity tolerance   Rehab Potential Good   OT Frequency 2x / week   OT Duration 4 weeks   OT Treatment/Interventions Self-care/ADL training;Cryotherapy;Moist Heat;Contrast Bath;Fluidtherapy;Iontophoresis;Therapeutic exercise;Manual Therapy;Passive range of motion;Splinting;Patient/family education   Plan assess Lourena Simmonds and tenderenss - SOC maybe fluido to assess if stiffness decrease - and 2 more ionto sessions maybe -  and then decrease 1 x wk and then 1 in 2 wks    OT Home Exercise Plan see pt instruction   Consulted and Agree with Plan of Care Patient        Problem List Patient Active Problem List   Diagnosis Date Noted  . Vaginal delivery 08/09/2014  . Third degree perineal laceration 08/09/2014    Oletta Cohn OTR/L,CLT 10/12/2015, 11:07 AM  [place] [facility] REGIONAL [facility] PHYSICAL AND SPORTS MEDICINE [address] [place], [address] Phone: [phone]   Fax:  [phone]  Name: Cynthia Farmer MRN: [id number] Date of Birth: August 01, 1986

## 2015-10-17 ENCOUNTER — Ambulatory Visit: Payer: BLUE CROSS/BLUE SHIELD | Admitting: Occupational Therapy

## 2015-10-17 DIAGNOSIS — R279 Unspecified lack of coordination: Secondary | ICD-10-CM

## 2015-10-17 DIAGNOSIS — M79631 Pain in right forearm: Secondary | ICD-10-CM

## 2015-10-17 DIAGNOSIS — M25641 Stiffness of right hand, not elsewhere classified: Secondary | ICD-10-CM

## 2015-10-17 NOTE — Patient Instructions (Signed)
Reviewed joint protection, neutral wrist positioning for taking care of her daughter and at work. Cont splint use right hand and stretching as issued previously.

## 2015-10-17 NOTE — Therapy (Signed)
[place] [facility] PHYSICAL AND SPORTS MEDICINE [address] [place], [place], [address] Phone: [phone]   Fax:  [phone]  Occupational Therapy Treatment  Patient Details  Name: Cynthia Farmer MRN: [id number] Date of Birth: 1986/04/19 No Data Recorded  Encounter Date: 10/17/2015    Past Medical History  Diagnosis Date  . Hx of varicella   . Medical history non-contributory     Past Surgical History  Procedure Laterality Date  . Stitches post-3rd degree tear during child birth 2015  2015    There were no vitals filed for this visit.  Visit Diagnosis:  Stiffness of finger joint of right hand  Pain of right forearm  Lack of coordination      Subjective Assessment - 10/17/15 0914    Subjective  Pt states that someone shook her hand hard over the weekend and she was in pain through right dorsal compartment. Pt also states that L thumb "could be more flexible, but it's good."    Patient Stated Goals I want to use my hand like before and the pain better    Currently in Pain? Yes   Pain Score 2    Pain Location Wrist   Pain Orientation Right   Pain Descriptors / Indicators Sore   Pain Type Acute pain   Pain Onset 1 to 4 weeks ago   Pain Frequency Occasional   Aggravating Factors  Moving right hand and wrist   Pain Relieving Factors Rest, splinting                      OT Treatments/Exercises (OP) - 10/17/15 0001    ADLs   ADL Comments Review joint protection techniques that she can implement while taking care of her daughter and at work.   Wrist Exercises   Other wrist exercises No tenderness - with Lourena SimmondsFinkelstein felt pull with thumb outside - could be more tightness/stiffness,    Other wrist exercises Wrist UD with thumbstatic to cont , prayer stretch on left only - 10 reps    Hand Exercises   Other Hand Exercises L only little discomfort between MP and IP if Finkelstein  with thumb inside of hand - but with pressure -  pt to cont with isometric for PA and RA of thumb - keep flexibility    Iontophoresis   Type of Iontophoresis Dexamethasone   Location R 1st dorsal compartment   Dose 1.0 small patch at 1.8 current and was able to tolerate with OT talking with her the whole time    Time 21   LUE Fluidotherapy   LUE Fluidotherapy Location Hand;Wrist   Comments Right hand/wrist fluidotherapy x10 min while performing gentle Finklesteins stretch and 1st dorsal compartment stretches in pain free range.   Splinting   Splinting Cont with thumb spica - prefab on R hand                 OT Education - 10/17/15 0939    Education provided Yes   Education Details HEP    Person(s) Educated Patient   Methods Explanation;Demonstration;Tactile cues;Verbal cues   Comprehension Verbalized understanding;Returned demonstration          OT Short Term Goals - 10/12/15 1101    OT SHORT TERM GOAL #1   Title Pain in L wrist on PRWHE improve by at least 10-15 points    Status Achieved   OT SHORT TERM GOAL #2   Title Pt to be ind in HEP  to increase AROM at wrist and thumb to [facility] when painfree   Baseline FOr L wrist and hand   Status Achieved   OT SHORT TERM GOAL #3   Title Pt verbalize at  least 4 modifications  and avoidance of provoking postures or activities in daily activities at home, with daughter and work    Status Achieved           OT Long Term Goals - 10/12/15 1102    OT LONG TERM GOAL #1   Title Function on PRWHE improve with at least  10 points to return to use hand without brace pain free   Baseline For L hand    Status Achieved   OT LONG TERM GOAL #2   Title Grip strength improve with 10 lbs to return to prior level of function    Baseline Grip strength R55; L 35 lbs ;  - not back to prior level because of pain R wrist    Status On-going   OT LONG TERM GOAL #3   Title AROM at R wrist and thumb increase to WNL without pain to wean out fo splint    Baseline Pain decrease this date and no  tenderness with thumb spica adn ionto - but start ROM next week    Time 4   Period Weeks   Status New               Plan - 10/17/15 0959    Plan Assess Finklesteins and tenderness along first dorsal compartemtn. Consider Fluidotherapy and gentle stretch, D/C Ionto today per pt request as pt has had 7 treatments and begin slowly increaseing AROM and decrease splint gradually.   OT Home Exercise Plan see pt instruction   Consulted and Agree with Plan of Care Patient        Problem List Patient Active Problem List   Diagnosis Date Noted  . Vaginal delivery 08/09/2014  . Third degree perineal laceration 08/09/2014    Barnhill, Amy Beth Dixon, OTR/L 10/17/2015, 10:02 AM  [place] [facility] REGIONAL [facility] PHYSICAL AND SPORTS MEDICINE [address] [place], [address] Phone: [phone]   Fax:  [phone]  Name: Cynthia Farmer MRN: [id number] Date of Birth: 14-Jun-1986

## 2015-10-18 ENCOUNTER — Ambulatory Visit: Payer: BLUE CROSS/BLUE SHIELD | Admitting: Physical Therapy

## 2015-10-19 ENCOUNTER — Ambulatory Visit: Payer: BLUE CROSS/BLUE SHIELD | Admitting: Occupational Therapy

## 2015-10-25 ENCOUNTER — Ambulatory Visit: Payer: BLUE CROSS/BLUE SHIELD | Admitting: Occupational Therapy

## 2015-10-25 ENCOUNTER — Ambulatory Visit: Payer: BLUE CROSS/BLUE SHIELD | Admitting: Physical Therapy

## 2015-10-25 DIAGNOSIS — M25531 Pain in right wrist: Secondary | ICD-10-CM

## 2015-10-25 DIAGNOSIS — M79642 Pain in left hand: Secondary | ICD-10-CM

## 2015-10-25 DIAGNOSIS — M79631 Pain in right forearm: Secondary | ICD-10-CM

## 2015-10-25 DIAGNOSIS — M25641 Stiffness of right hand, not elsewhere classified: Secondary | ICD-10-CM

## 2015-10-25 DIAGNOSIS — M25532 Pain in left wrist: Secondary | ICD-10-CM

## 2015-10-25 DIAGNOSIS — R279 Unspecified lack of coordination: Secondary | ICD-10-CM

## 2015-10-25 DIAGNOSIS — M6289 Other specified disorders of muscle: Secondary | ICD-10-CM

## 2015-10-25 DIAGNOSIS — M629 Disorder of muscle, unspecified: Secondary | ICD-10-CM

## 2015-10-25 DIAGNOSIS — M6281 Muscle weakness (generalized): Secondary | ICD-10-CM

## 2015-10-25 NOTE — Therapy (Signed)
[place] [facility] PHYSICAL AND SPORTS MEDICINE [address], [place], [address] Phone: [phone]   Fax:  [phone]  Occupational Therapy Treatment  Patient Details  Name: Shakeita Vandevander MRN: [id number] Date of Birth: 08-03-1986 No Data Recorded  Encounter Date: 10/25/2015      OT End of Session - 10/25/15 1005    Visit Number 20   Number of Visits 22   Date for OT Re-Evaluation 11/16/15   OT Start Time 0909   OT Stop Time 0950   OT Time Calculation (min) 41 min   Equipment Utilized During Treatment E-stim   Activity Tolerance Patient tolerated treatment well   Behavior During Therapy [facility] for tasks assessed/performed      Past Medical History  Diagnosis Date  . Hx of varicella   . Medical history non-contributory     Past Surgical History  Procedure Laterality Date  . Stitches post-3rd degree tear during child birth 2015  2015    There were no vitals filed for this visit.  Visit Diagnosis:  Stiffness of finger joint of right hand  Pain of right forearm  Lack of coordination  Muscle weakness  Left wrist pain  Pain of left hand  Right wrist pain      Subjective Assessment - 10/25/15 0909    Subjective  Pt reports that "It's getting better" She states tha tshe's removing splint for short breaks during the day intermittently, she may only leave it off for a short amount of time and then reapply and other times, she can use the neoprene brace.    Patient Stated Goals I want to use my hand like before and the pain better    Currently in Pain? Yes   Pain Score 2    Pain Location Wrist   Pain Orientation Right   Pain Descriptors / Indicators Sore   Pain Type Acute pain   Pain Onset More than a month ago   Pain Frequency Occasional   Aggravating Factors  Moving right hand and wrist    Pain Relieving Factors Rest, splinting                      OT Treatments/Exercises (OP) - 10/25/15 0001    Exercises   Exercises Wrist;Hand  right   Hand Exercises   Other Hand Exercises R with some discomfort between MP and IP if Finkelstein with thumb inside of hand with pressure - pt to cont with isometric for PA and RA of thumb; instructed in cross friction massage 1st dorsal compartment followed by cryotherapy after - keep flexibility Pt reports increased symptoms bilateral hands over last week as her daughter was sick and required increased care. She was educated to cont home stretching and intermittent use of splint for left PRN, which is what she ended up doing per her report.   Emergency planning/management officer R EPB and extensor mass   Buyer, retail program for acute pain right 1st dorsal compartment   Electrical Stimulation Parameters Channel 1 3.2 along first dorsal compartment and extensor muscle mass x10 min   Electrical Stimulation Goals Pain   RUE Fluidotherapy   Number Minutes Fluidotherapy 10 Minutes   RUE Fluidotherapy Location Hand;Wrist   Comments Pt in fluidotherapy x10 min R hand while performing stretches and ther ex. to assist with decreased pain and increased AROM   Splinting   Splinting Cont with thumb spica - prefab on R hand  switching between neoprene and regular thumb spica   try sleeping in neoprene splint at night   Manual Therapy   Manual therapy comments Tender at R 1st dorsal compartment - pt tolerated cross friction massage x55min followed by cryotherapy after stretches.     Pt instruction:  Cross friction massage R 1st dorsal compartment x13min followed by HEP then cryotherapy. Pt to wear thumb spica during the day and begin wearing neoprene thumb spica at noc as tolerated. She cont with tenderness along 1st dorsal compartment with palpation. Intermittent pain/symptoms on left this past week with daughter being sick for 5 days - will perform HEP & neoprene splint PRN on left. She verbalized understanding of this in clinic  today.          OT Education - 10/25/15 1004    Education provided Yes   Education Details HEP   Person(s) Educated Patient   Methods Explanation;Demonstration;Verbal cues;Tactile cues   Comprehension Verbalized understanding;Returned demonstration;Verbal cues required          OT Short Term Goals - 10/12/15 1101    OT SHORT TERM GOAL #1   Title Pain in L wrist on PRWHE improve by at least 10-15 points    Status Achieved   OT SHORT TERM GOAL #2   Title Pt to be ind in HEP  to increase AROM at wrist and thumb to [facility] when painfree   Baseline FOr L wrist and hand   Status Achieved   OT SHORT TERM GOAL #3   Title Pt verbalize at  least 4 modifications  and avoidance of provoking postures or activities in daily activities at home, with daughter and work    Status Achieved           OT Long Term Goals - 10/12/15 1102    OT LONG TERM GOAL #1   Title Function on PRWHE improve with at least  10 points to return to use hand without brace pain free   Baseline For L hand    Status Achieved   OT LONG TERM GOAL #2   Title Grip strength improve with 10 lbs to return to prior level of function    Baseline Grip strength R55; L 35 lbs ;  - not back to prior level because of pain R wrist    Status On-going   OT LONG TERM GOAL #3   Title AROM at R wrist and thumb increase to WNL without pain to wean out fo splint    Baseline Pain decrease this date and no tenderness with thumb spica adn ionto - but start ROM next week    Time 4   Period Weeks   Status New               Plan - 10/25/15 1006    Clinical Impression Statement Pt cont to have symptoms of DeQuervain's and pain right 1st dorsal compartment (with c/o flare along left secondary to her daughter being sick for 5 days last week), she should benefit from performance of HEP as well as crossfriction massage and cryotherapy. She will use splint on left PRN due to flare and cont with splint right as previously discussed  today.   Rehab Potential Good   OT Frequency 2x / week   OT Duration 4 weeks   OT Treatment/Interventions Self-care/ADL training;Cryotherapy;Moist Heat;Contrast Bath;Fluidtherapy;Iontophoresis;Therapeutic exercise;Manual Therapy;Passive range of motion;Splinting;Patient/family education   Plan Assess R for Finklestein's & first dorsal compartment tenderness. Fluidotherapy while performing gentle stretching and E-stim as  tolerated. Assess/check goals   OT Home Exercise Plan see pt instruction   Consulted and Agree with Plan of Care Patient        Problem List Patient Active Problem List   Diagnosis Date Noted  . Vaginal delivery 08/09/2014  . Third degree perineal laceration 08/09/2014    Rayyan Orsborn Beth Dixon, OTR/L 10/25/2015, 10:12 AM  [place] [facility]TS MEDICINE [address] [place], [place], [address] Phone: [phone]   Fax:  [phone]  Name: Estell HarpinKristin Cornwall MRN: [id number] Date of Birth: 14-Dec-1985

## 2015-10-25 NOTE — Patient Instructions (Addendum)
Pt instruction:  Cross friction massage R 1st dorsal compartment x505min followed by HEP then cryotherapy. Pt to wear thumb spica during the day and begin wearing neoprene thumb spica at noc as tolerated. She cont with tenderness along 1st dorsal compartment with palpation. Intermittent pain/symptoms on left this past week with daughter being sick for 5 days - will perform HEP & neoprene splint PRN on left. She verbalized understanding of this in clinic today.

## 2015-10-26 NOTE — Patient Instructions (Signed)
PELVIC FLOOR / KEGEL EXERCISES   Pelvic floor/ Kegel exercises are used to strengthen the muscles in the base of your pelvis that are responsible for supporting your pelvic organs and preventing urine/feces leakage. Based on your therapist's recommendations, they can be performed while standing, sitting, or lying down. Imagine pelvic floor area as a diamond with pelvic landmarks: top =pubic bone, bottom tip=tailbone, sides=sitting bones (ischial tuberosities).    Make yourself aware of this muscle group by using these cues while coordinating your breath:  Inhale, feel pelvic floor diamond area lower like hammock towards your feet and ribcage/belly expanding. Pause. Let the exhale naturally and feel your belly sink, abdominal muscles hugging in around you and you may notice the pelvic diamond draws upward towards your head forming a umbrella shape. Give a squeeze during the exhalation like you are stopping the flow of urine. If you are squeezing the buttock muscles, try to give 50% less effort.   Common Errors:  Breath holding: If you are holding your breath, you may be bearing down against your bladder instead of pulling it up. If you belly bulges up while you are squeezing, you are holding your breath. Be sure to breathe gently in and out while exercising. Counting out loud may help you avoid holding your breath.  Accessory muscle use: You should not see or feel other muscle movement when performing pelvic floor exercises. When done properly, no one can tell that you are performing the exercises. Keep the buttocks, belly and inner thighs relaxed.  Overdoing it: Your muscles can fatigue and stop working for you if you over-exercise. You may actually leak more or feel soreness at the lower abdomen or rectum.  YOUR HOME EXERCISE PROGRAM  LONG HOLDS: Position: on back  Inhale and then exhale. Then squeeze the muscle and count aloud for 10 seconds. Rest with three long breaths. (Be sure to let  belly sink in with exhales and not push outward)  Perform 4 repetitions, 3 times/day  SHORT HOLDS: Position: on back, sitting   Inhale and then exhale. Then squeeze the muscle.  (Be sure to let belly sink in with exhales and not push outward)  Perform 5 repetitions, 5  Times/day                      DECREASE DOWNWARD PRESSURE ON  YOUR PELVIC FLOOR, ABDOMINAL, LOW BACK MUSCLES       PRESERVE YOUR PELVIC HEALTH LONG-TERM   ** SQUEEZE pelvic floor BEFORE YOUR SNEEZE, COUGH, LAUGH   ** EXHALE BEFORE YOU RISE AGAINST GRAVITY (lifting, sit to stand, from squat to stand)   ** LOG ROLL OUT OF BED INSTEAD OF CRUNCH/SIT-UP

## 2015-10-26 NOTE — Therapy (Signed)
[place] [facility] [address] [place], [place], [address] Phone: [phone]   Fax:  [phone]  Physical Therapy Treatment/ Discharge Summary   Patient Details  Name: Cynthia Farmer MRN: [id number] Date of Birth: [birth year] Referring Provider: Charlotta Newtonzan  Encounter Date: 10/25/2015      PT End of Session - 10/26/15 0956    Visit Number 7   Number of Visits 12   Date for PT Re-Evaluation 11/14/15   PT Start Time 1610   PT Stop Time 1650   PT Time Calculation (min) 40 min   Activity Tolerance Patient tolerated treatment well;No increased pain   Behavior During Therapy [facility] for tasks assessed/performed      Past Medical History  Diagnosis Date  . Hx of varicella   . Medical history non-contributory     Past Surgical History  Procedure Laterality Date  . Stitches post-3rd degree tear during child birth 2015  2015    There were no vitals filed for this visit.  Visit Diagnosis:  Pelvic floor dysfunction  Fascial defect  Lack of coordination      Subjective Assessment - 10/25/15 1617    Subjective Pt reported she has been able to gain some sensation in the perineal area like "sparks". Pt feels "tightness" rather than "pain" during sexual intercourse.    Pertinent History Hx of 1st pregnancy: difficulty with walking long distances in the last weeks, 10 hours of labor, 7 lb, 9.5 oz. Hobbies: dance, ballroom dancing    Patient Stated Goals pt would like to make any type of movement she would like to and not have it feel painful             [facility] - 10/25/15 1647    Observation/Other Assessments   Other Surveys  --  FSFI 92% on 10/25/15, (SOC: 08/05/15: 72%)                   Pelvic Floor Special Questions - 10/26/15 0950    Pelvic Floor Internal Exam pt provided verbal consent with no contraindications   Exam Type Vaginal   Palpation no tenderness nor tensions noted   Strength # of reps 4    Strength # of seconds 10           [facility] Adult PT Treatment/Exercise - 10/26/15 0955    Self-Care   Self-Care --  d/c plans, continuation of pelvic floor strength   Neuro Re-ed    Neuro Re-ed Details  contraction of all three pelvic floor mm layers instead of only posterior mm   endurance pelvic floor training, administered FSFI                     PT Long Term Goals - 10/25/15 1648    PT LONG TERM GOAL #1   Title Pt will increase her FSFI score from 72% to > 80% in order to improve QOL. (12/20: 92%)    Time 12   Period Weeks   Status Achieved   PT LONG TERM GOAL #2   Title Pt will demo increased scar mobility with no report of pain/tenderness with palpation over scar in order to progress to pelvic floor/ deep core strengthening.    Time 12   Period Weeks   Status Achieved   PT LONG TERM GOAL #3   Title Pt will demo no tenderness nor tensions in obturator internus muscles bilaterally in order to improve tolerate sexual intercourse without  pain.   Time 12   Period Weeks   Status Achieved               Plan - 10/25/15 1651    Clinical Impression Statement Pt reported her Sx improved "Quite a bit better" according to the [facility] and has no more pain with sexual intercourse nor any tenderness around the perineal scar. Pt also reports she has now started to feel more sensation in the area whereas it had felt numb at the [facility]. Pt has achieved 100% of her goals and demonstrates ability to perform pelvic floor contractions properly and learned to continue building her pelvic floor endurance for post-partum recovery.  Pt is ready for D/C. Thank you for your referral.     Pt will benefit from skilled therapeutic intervention in order to improve on the following deficits Decreased strength;Decreased activity tolerance;Decreased endurance;Decreased range of motion;Decreased coordination;Decreased mobility;Decreased scar mobility;Increased muscle spasms;Postural  dysfunction;Increased fascial restricitons;Impaired sensation;Pain;Improper body mechanics;Decreased safety awareness;Decreased knowledge of precautions;Impaired flexibility   Rehab Potential Good   PT Frequency 1x / week   PT Duration 12 weeks   PT Treatment/Interventions ADLs/Self Care Home Management;Biofeedback;Cryotherapy;Electrical Stimulation;Neuromuscular re-education;Balance training;Therapeutic exercise;Manual techniques;Functional mobility training;Patient/family education;Therapeutic activities;Scar mobilization;Passive range of motion;Energy conservation;Taping;Moist Heat;Traction;Gait training   Consulted and Agree with Plan of Care Patient        Problem List Patient Active Problem List   Diagnosis Date Noted  . Vaginal delivery 08/09/2014  . Third degree perineal laceration 08/09/2014    Mariane Masters  ,PT, DPT, E-RYT   10/26/2015, 9:59 AM  [place] [facility] MAIN [facility] SERVICES [address] [place], [place], [address] Phone: [phone]   Fax:  [phone]  Name: Cynthia Farmer MRN: [id number] Date of Birth: [birth year]

## 2015-11-01 ENCOUNTER — Ambulatory Visit: Payer: BLUE CROSS/BLUE SHIELD | Admitting: Occupational Therapy

## 2016-05-23 IMAGING — CR DG HAND COMPLETE 3+V*L*
1 series · 3 of 3 positions shown · non-contrast
Comparison: None.

CLINICAL DATA: Pain in the dorsal left hand at the base of the
thumb. No reported injury.

EXAM:
LEFT HAND - COMPLETE 3+ VIEW

[Series 1: dg hand complete left · 0.14mm/px · 3 of 3 slices shown]
[im 1/3]
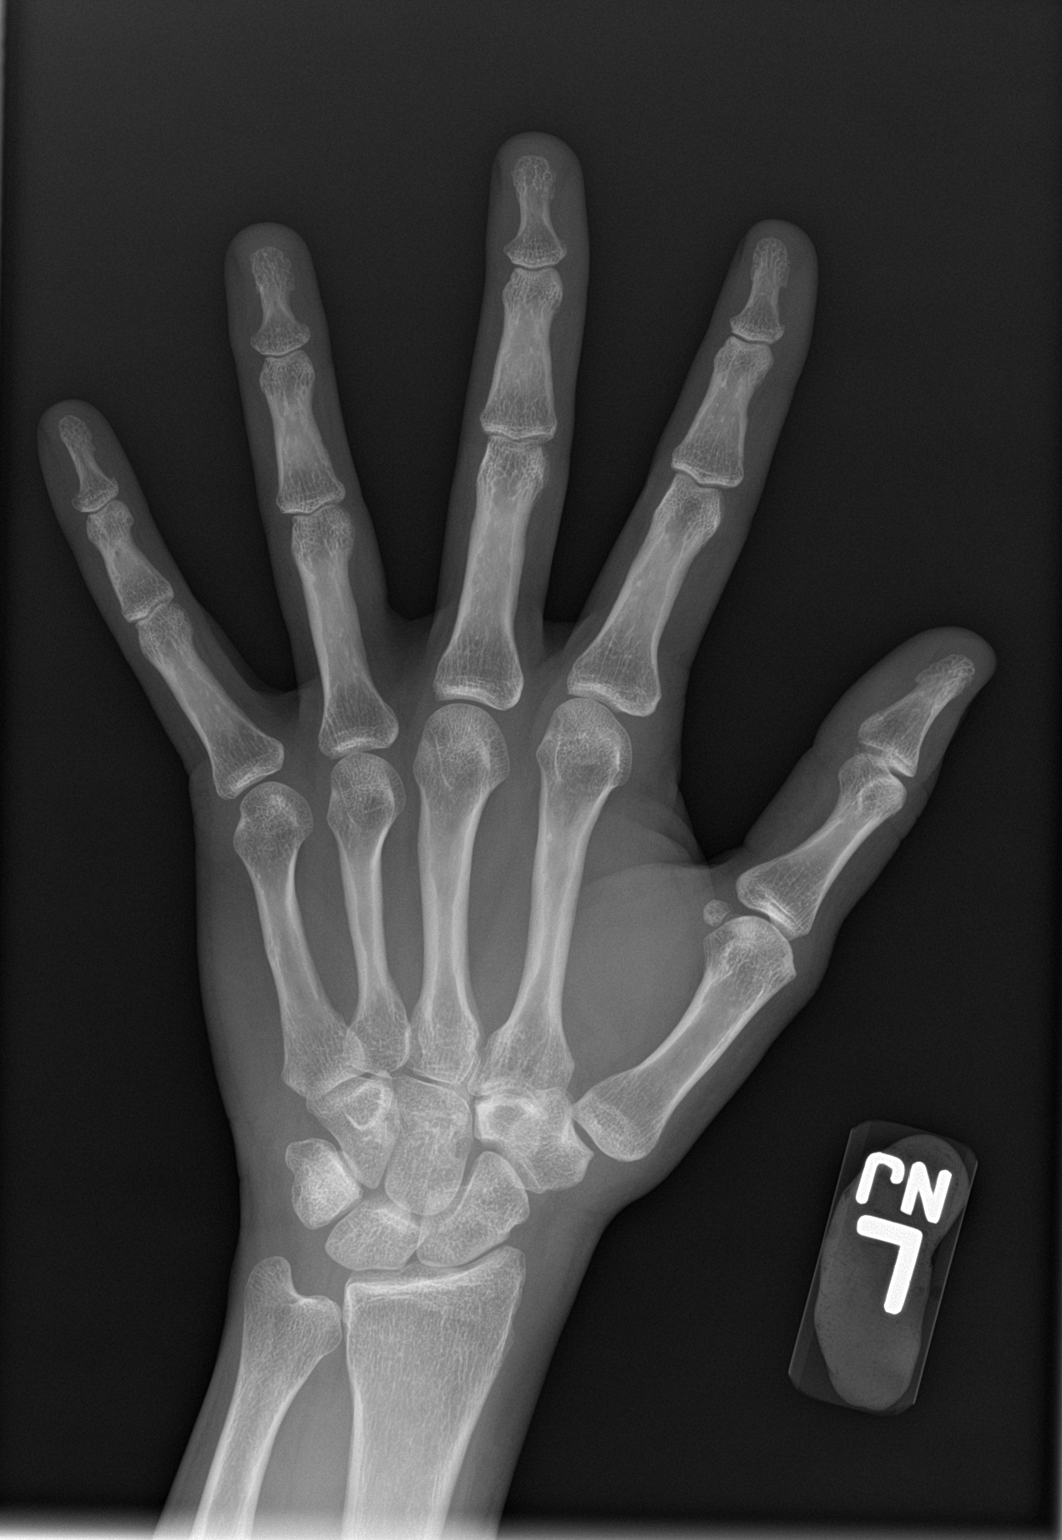
[im 2/3]
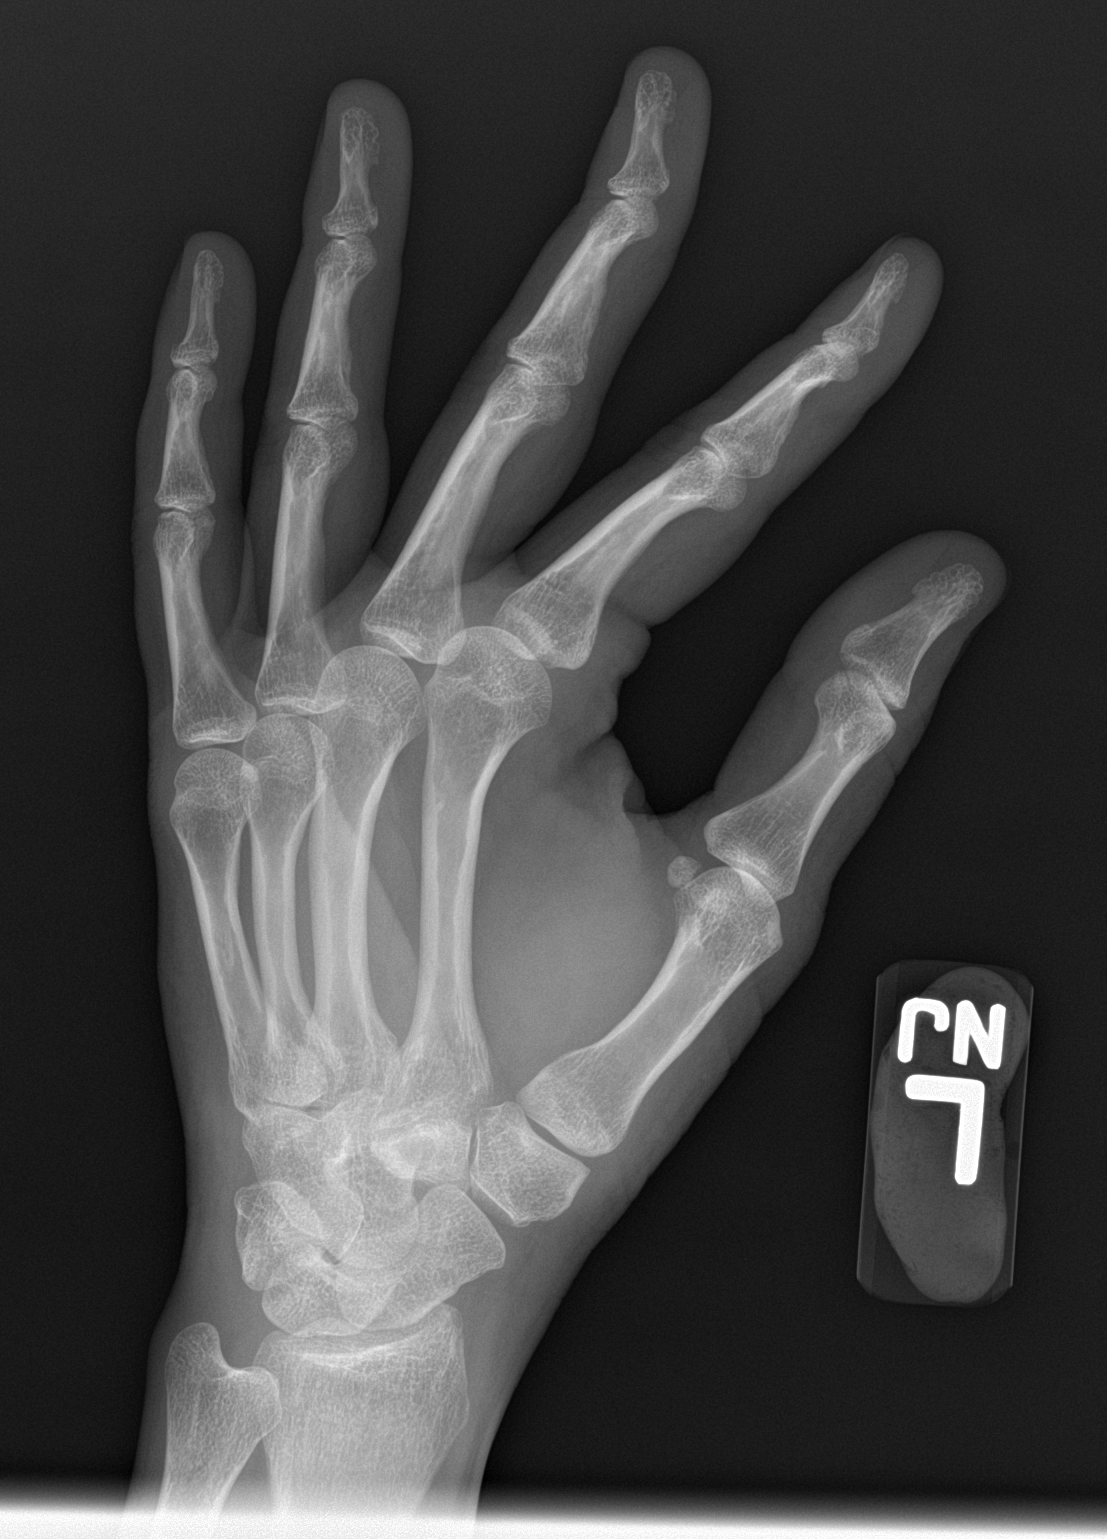
[im 3/3]
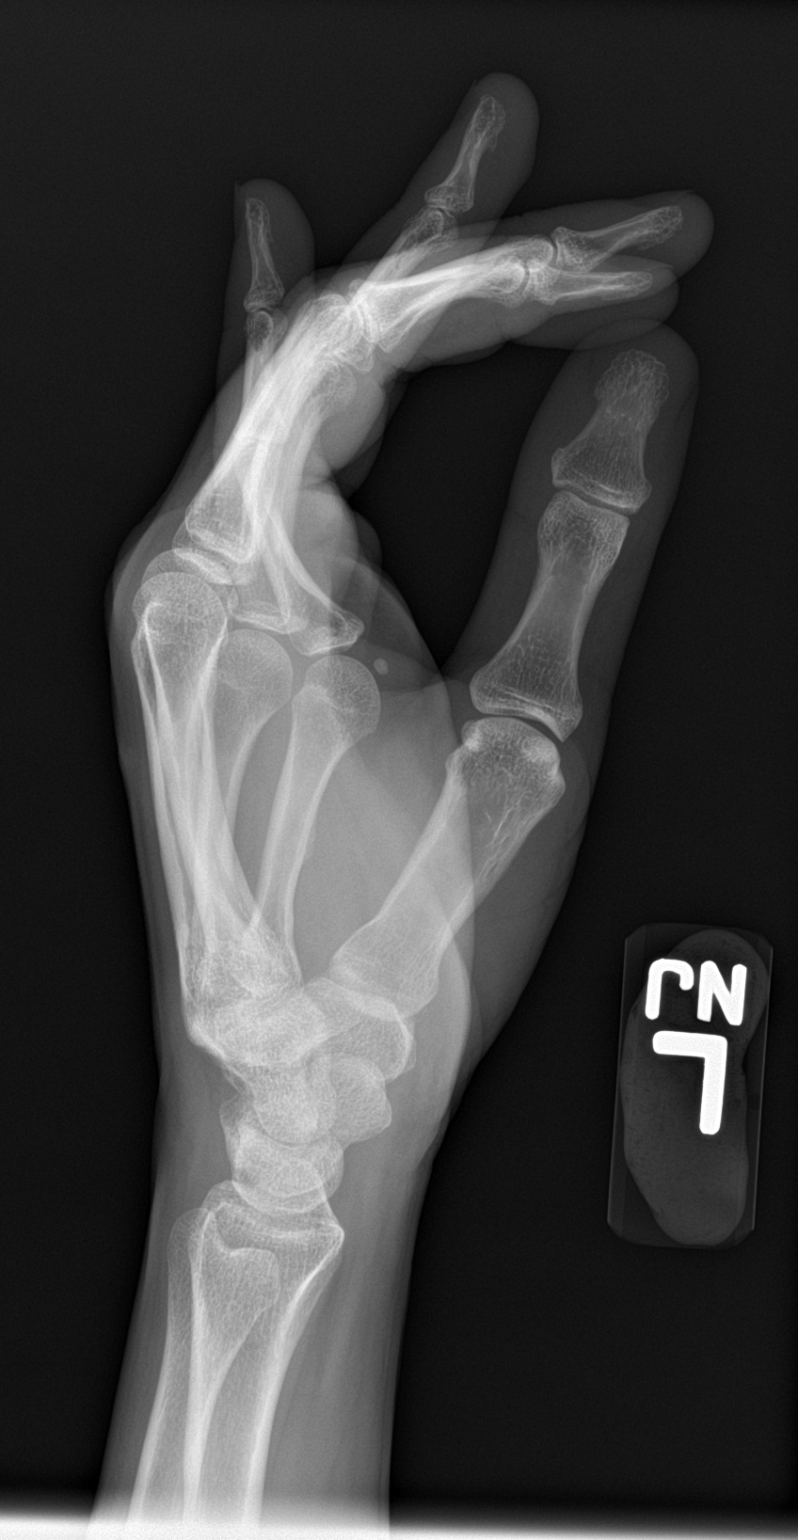

[3 of 3 positions shown; findings below may reference images not displayed]

FINDINGS: No fracture, dislocation or suspicious focal osseous lesion is seen
in the left hand. Joint spaces appear normal, with no degenerative
or erosive arthropathy. No pathologic soft tissue calcifications or
radiopaque foreign bodies.
IMPRESSION: No fracture, malalignment or appreciable arthropathy in the left
hand.

## 2016-08-24 ENCOUNTER — Other Ambulatory Visit (HOSPITAL_COMMUNITY)
Admission: RE | Admit: 2016-08-24 | Discharge: 2016-08-24 | Disposition: A | Discharge status: Home / Self Care | Payer: BLUE CROSS/BLUE SHIELD | Source: Ambulatory Visit | Attending: Obstetrics & Gynecology | Admitting: Obstetrics & Gynecology

## 2016-08-24 ENCOUNTER — Other Ambulatory Visit: Payer: Self-pay | Admitting: Obstetrics & Gynecology

## 2016-08-24 DIAGNOSIS — Z01419 Encounter for gynecological examination (general) (routine) without abnormal findings: Secondary | ICD-10-CM | POA: Diagnosis not present

## 2016-08-24 DIAGNOSIS — Z1151 Encounter for screening for human papillomavirus (HPV): Secondary | ICD-10-CM | POA: Insufficient documentation

## 2016-08-28 LAB — CYTOLOGY - PAP
Diagnosis: NEGATIVE
HPV: NOT DETECTED

## 2016-12-11 DIAGNOSIS — D485 Neoplasm of uncertain behavior of skin: Secondary | ICD-10-CM | POA: Diagnosis not present

## 2016-12-11 DIAGNOSIS — D225 Melanocytic nevi of trunk: Secondary | ICD-10-CM | POA: Diagnosis not present

## 2017-01-04 ENCOUNTER — Other Ambulatory Visit: Payer: Self-pay

## 2017-01-04 DIAGNOSIS — R42 Dizziness and giddiness: Secondary | ICD-10-CM

## 2017-01-25 ENCOUNTER — Ambulatory Visit: Payer: Self-pay

## 2017-01-25 ENCOUNTER — Ambulatory Visit: Payer: Self-pay | Admitting: Medical

## 2017-01-25 ENCOUNTER — Encounter: Payer: Self-pay | Admitting: Medical

## 2017-01-25 VITALS — BP 100/70 | HR 87 | Temp 98.8°F | Resp 16 | Ht 67.0 in | Wt 150.0 lb

## 2017-01-25 DIAGNOSIS — H1033 Unspecified acute conjunctivitis, bilateral: Secondary | ICD-10-CM

## 2017-01-25 MED ORDER — CIPROFLOXACIN HCL 0.3 % OP SOLN: 1.0000 [drp] | OPHTHALMIC | 0 refills | Status: DC

## 2017-01-25 NOTE — Progress Notes (Signed)
   Subjective:    Patient ID: Cynthia HarpinKristin Farmer, female    DOB: 07/08/1986, 31 y.o.   MRN: [id number]  HPI    Review of Systems  Constitutional: Negative for chills and fever.  HENT: Negative for congestion, ear pain, postnasal drip, rhinorrhea and sneezing.   Eyes: Positive for pain, discharge, redness and itching. Negative for photophobia and visual disturbance.  Respiratory: Negative.   Cardiovascular: Negative.        Objective:   Physical Exam  Constitutional: She appears well-developed and well-nourished.  HENT:  Head: Normocephalic and atraumatic.  Eyes: EOM are normal. Pupils are equal, round, and reactive to light. Right eye exhibits no chemosis, no discharge, no exudate and no hordeolum. No foreign body present in the right eye. Left eye exhibits no chemosis, no discharge, no exudate and no hordeolum. No foreign body present in the left eye. Right conjunctiva is injected. Left conjunctiva is injected. No scleral icterus.            Assessment & Plan:  Conjunctivitis Cipro 0.3% optic drops apply 1 drop into both eyes every 4 hours while awake x  5-7 days 5ml no rx. Wash hands before and after treatment. Use a tissue to blot do not rub eyes. Use your own towel at home. Return to the clinic in  3 -5 days if not improving.

## 2017-08-27 DIAGNOSIS — Z01419 Encounter for gynecological examination (general) (routine) without abnormal findings: Secondary | ICD-10-CM | POA: Diagnosis not present

## 2017-09-06 DIAGNOSIS — Z13220 Encounter for screening for lipoid disorders: Secondary | ICD-10-CM | POA: Diagnosis not present

## 2017-09-06 DIAGNOSIS — Z0000 Encounter for general adult medical examination without abnormal findings: Secondary | ICD-10-CM | POA: Diagnosis not present

## 2018-02-19 DIAGNOSIS — D2262 Melanocytic nevi of left upper limb, including shoulder: Secondary | ICD-10-CM | POA: Diagnosis not present

## 2018-02-19 DIAGNOSIS — D2261 Melanocytic nevi of right upper limb, including shoulder: Secondary | ICD-10-CM | POA: Diagnosis not present

## 2018-02-19 DIAGNOSIS — D225 Melanocytic nevi of trunk: Secondary | ICD-10-CM | POA: Diagnosis not present

## 2018-02-19 DIAGNOSIS — L710 Perioral dermatitis: Secondary | ICD-10-CM | POA: Diagnosis not present

## 2018-04-08 ENCOUNTER — Encounter: Payer: Self-pay | Admitting: Medical

## 2018-04-08 ENCOUNTER — Ambulatory Visit: Payer: Self-pay | Admitting: Medical

## 2018-04-08 VITALS — BP 119/83 | HR 90 | Temp 98.5°F | Resp 16 | Wt 141.6 lb

## 2018-04-08 DIAGNOSIS — H1013 Acute atopic conjunctivitis, bilateral: Secondary | ICD-10-CM

## 2018-04-08 NOTE — Progress Notes (Signed)
   Subjective:    Patient ID: Estell HarpinKristin Johanning, female    DOB: Feb 26, 1986, 32 y.o.   MRN: [id number]  HPI 32 yo female in non acute distress.  Complains of left eye crusting more than right eye x one week. Initially itching which patient thought was due to allergies, felt irritated that night and with some crusting. Overnight it seemed to have gotten better.ten a couple of days later it seemed worse, and  Then since then has continued to improve. No known exposure to pink eye.  No foregin body sensation. No visual changes. Taking Zyrtec daily x  2 months for seasonal allergies.    Review of Systems  Constitutional: Negative for chills and fever.  Eyes: Positive for discharge (crusting in eye, improving but still has some), redness (better now ( left)) and itching (initially worse better now). Negative for photophobia, pain and visual disturbance.       Objective:   Physical Exam  Constitutional: She is oriented to person, place, and time. She appears well-developed and well-nourished.  HENT:  Head: Normocephalic and atraumatic.  Eyes: Pupils are equal, round, and reactive to light. Conjunctivae and EOM are normal. Right eye exhibits no discharge. Left eye exhibits no discharge.  Neck: Normal range of motion. Neck supple.  Lymphadenopathy:    She has no cervical adenopathy.  Neurological: She is alert and oriented to person, place, and time.  Skin: Skin is warm and dry.  Psychiatric: She has a normal mood and affect. Her behavior is normal. Judgment and thought content normal.       Assessment & Plan:  Eye allergies bilateral Switch  OTC Claritin to OTC Zyrtec take as directed.Rinse eyes with eye saline  3-4 times a day to help rinse out pollen. If crusting gets worse over the next 2-3 days to  call the office and I will call in an eye antibiotic It is noted that the last week the pollen count has been high  Noted patients allergies reviewed. Patient verbalizes understanding and has no  questions at discharge.  Computer not working so could not do chart or AVS print out.

## 2018-08-29 DIAGNOSIS — Z01411 Encounter for gynecological examination (general) (routine) with abnormal findings: Secondary | ICD-10-CM | POA: Diagnosis not present

## 2018-09-03 DIAGNOSIS — R102 Pelvic and perineal pain: Secondary | ICD-10-CM | POA: Diagnosis not present

## 2018-09-03 DIAGNOSIS — M6281 Muscle weakness (generalized): Secondary | ICD-10-CM | POA: Diagnosis not present

## 2018-09-10 DIAGNOSIS — R102 Pelvic and perineal pain: Secondary | ICD-10-CM | POA: Diagnosis not present

## 2018-09-10 DIAGNOSIS — M6281 Muscle weakness (generalized): Secondary | ICD-10-CM | POA: Diagnosis not present

## 2019-07-01 DIAGNOSIS — Z3049 Encounter for surveillance of other contraceptives: Secondary | ICD-10-CM | POA: Diagnosis not present

## 2019-09-03 DIAGNOSIS — Z23 Encounter for immunization: Secondary | ICD-10-CM | POA: Diagnosis not present

## 2019-09-03 DIAGNOSIS — R87610 Atypical squamous cells of undetermined significance on cytologic smear of cervix (ASC-US): Secondary | ICD-10-CM | POA: Diagnosis not present

## 2019-09-03 DIAGNOSIS — Z01419 Encounter for gynecological examination (general) (routine) without abnormal findings: Secondary | ICD-10-CM | POA: Diagnosis not present

## 2019-10-09 DIAGNOSIS — H938X9 Other specified disorders of ear, unspecified ear: Secondary | ICD-10-CM | POA: Diagnosis not present

## 2019-11-19 DIAGNOSIS — Z7289 Other problems related to lifestyle: Secondary | ICD-10-CM | POA: Diagnosis not present

## 2019-11-19 DIAGNOSIS — M2669 Other specified disorders of temporomandibular joint: Secondary | ICD-10-CM | POA: Diagnosis not present

## 2019-11-19 DIAGNOSIS — M26629 Arthralgia of temporomandibular joint, unspecified side: Secondary | ICD-10-CM | POA: Insufficient documentation

## 2019-11-19 DIAGNOSIS — H9201 Otalgia, right ear: Secondary | ICD-10-CM | POA: Diagnosis not present

## 2019-12-08 DIAGNOSIS — M2669 Other specified disorders of temporomandibular joint: Secondary | ICD-10-CM | POA: Diagnosis not present

## 2019-12-08 DIAGNOSIS — M2560 Stiffness of unspecified joint, not elsewhere classified: Secondary | ICD-10-CM | POA: Diagnosis not present

## 2019-12-08 DIAGNOSIS — M26609 Unspecified temporomandibular joint disorder, unspecified side: Secondary | ICD-10-CM | POA: Diagnosis not present

## 2019-12-08 DIAGNOSIS — R293 Abnormal posture: Secondary | ICD-10-CM | POA: Diagnosis not present

## 2020-01-07 DIAGNOSIS — Z23 Encounter for immunization: Secondary | ICD-10-CM | POA: Diagnosis not present

## 2020-01-26 DIAGNOSIS — Z20828 Contact with and (suspected) exposure to other viral communicable diseases: Secondary | ICD-10-CM | POA: Diagnosis not present

## 2020-02-12 DIAGNOSIS — Z23 Encounter for immunization: Secondary | ICD-10-CM | POA: Diagnosis not present

## 2020-02-13 ENCOUNTER — Other Ambulatory Visit: Payer: Self-pay

## 2020-02-13 ENCOUNTER — Encounter (HOSPITAL_COMMUNITY): Payer: Self-pay

## 2020-02-13 ENCOUNTER — Emergency Department (HOSPITAL_COMMUNITY)
Admission: EM | Admit: 2020-02-13 | Discharge: 2020-02-13 | Disposition: A | Discharge status: Home / Self Care | Payer: BC Managed Care – PPO | Attending: Emergency Medicine | Admitting: Emergency Medicine

## 2020-02-13 DIAGNOSIS — T50Z95A Adverse effect of other vaccines and biological substances, initial encounter: Secondary | ICD-10-CM

## 2020-02-13 DIAGNOSIS — R1111 Vomiting without nausea: Secondary | ICD-10-CM | POA: Diagnosis not present

## 2020-02-13 DIAGNOSIS — R112 Nausea with vomiting, unspecified: Secondary | ICD-10-CM | POA: Diagnosis not present

## 2020-02-13 DIAGNOSIS — T50B95A Adverse effect of other viral vaccines, initial encounter: Secondary | ICD-10-CM | POA: Insufficient documentation

## 2020-02-13 DIAGNOSIS — R55 Syncope and collapse: Secondary | ICD-10-CM | POA: Insufficient documentation

## 2020-02-13 DIAGNOSIS — E876 Hypokalemia: Secondary | ICD-10-CM | POA: Diagnosis not present

## 2020-02-13 DIAGNOSIS — R1110 Vomiting, unspecified: Secondary | ICD-10-CM | POA: Diagnosis not present

## 2020-02-13 LAB — COMPREHENSIVE METABOLIC PANEL
ALT: 27 U/L (ref 0–44)
AST: 20 U/L (ref 15–41)
Albumin: 3.5 g/dL (ref 3.5–5.0)
Alkaline Phosphatase: 54 U/L (ref 38–126)
Anion gap: 6 (ref 5–15)
BUN: 10 mg/dL (ref 6–20)
CO2: 22 mmol/L (ref 22–32)
Calcium: 7.8 mg/dL — ABNORMAL LOW (ref 8.9–10.3)
Chloride: 108 mmol/L (ref 98–111)
Creatinine, Ser: 0.69 mg/dL (ref 0.44–1.00)
GFR calc Af Amer: >60 mL/min (ref >60)
GFR calc non Af Amer: >60 mL/min (ref >60)
Glucose, Bld: 106 mg/dL — ABNORMAL HIGH (ref 70–99)
Potassium: 3 mmol/L — ABNORMAL LOW (ref 3.5–5.1)
Sodium: 136 mmol/L (ref 135–145)
Total Bilirubin: 0.5 mg/dL (ref 0.3–1.2)
Total Protein: 6.4 g/dL — ABNORMAL LOW (ref 6.5–8.1)

## 2020-02-13 LAB — MAGNESIUM: Magnesium: 1.6 mg/dL — ABNORMAL LOW (ref 1.7–2.4)

## 2020-02-13 LAB — CBC WITH DIFFERENTIAL/PLATELET
Abs Immature Granulocytes: 0.02 10*3/uL (ref 0.00–0.07)
Basophils Absolute: 0 10*3/uL (ref 0.0–0.1)
Basophils Relative: 0 %
Eosinophils Absolute: 0 10*3/uL (ref 0.0–0.5)
Eosinophils Relative: 0 %
HCT: 35.8 % — ABNORMAL LOW (ref 36.0–46.0)
Hemoglobin: 12.1 g/dL (ref 12.0–15.0)
Immature Granulocytes: 0 %
Lymphocytes Relative: 16 %
Lymphs Abs: 1.6 10*3/uL (ref 0.7–4.0)
MCH: 29.4 pg (ref 26.0–34.0)
MCHC: 33.8 g/dL (ref 30.0–36.0)
MCV: 87.1 fL (ref 80.0–100.0)
Monocytes Absolute: 0.8 10*3/uL (ref 0.1–1.0)
Monocytes Relative: 8 %
Neutro Abs: 7.4 10*3/uL (ref 1.7–7.7)
Neutrophils Relative %: 76 %
Platelets: 261 10*3/uL (ref 150–400)
RBC: 4.11 MIL/uL (ref 3.87–5.11)
RDW: 12.1 % (ref 11.5–15.5)
WBC: 9.8 10*3/uL (ref 4.0–10.5)
nRBC: 0 % (ref 0.0–0.2)

## 2020-02-13 LAB — VITAMIN D 25 HYDROXY (VIT D DEFICIENCY, FRACTURES): Vit D, 25-Hydroxy: 32.21 ng/mL (ref 30–100)

## 2020-02-13 LAB — PHOSPHORUS: Phosphorus: 1.9 mg/dL — ABNORMAL LOW (ref 2.5–4.6)

## 2020-02-13 LAB — LIPASE, BLOOD: Lipase: 34 U/L (ref 11–51)

## 2020-02-13 MED ORDER — ONDANSETRON HCL 4 MG/2ML IJ SOLN
4.0000 mg | Freq: Once | INTRAMUSCULAR | Status: AC
  Administered 2020-02-13: 02:00:00 4 mg via INTRAVENOUS
  Filled 2020-02-13: qty 2

## 2020-02-13 MED ORDER — POTASSIUM CHLORIDE CRYS ER 20 MEQ PO TBCR: 20.0000 meq | EXTENDED_RELEASE_TABLET | Freq: Two times a day (BID) | ORAL | 0 refills | Status: DC

## 2020-02-13 MED ORDER — SODIUM CHLORIDE 0.9 % IV BOLUS
1000.0000 mL | Freq: Once | INTRAVENOUS | Status: AC
  Administered 2020-02-13: 1000 mL via INTRAVENOUS

## 2020-02-13 MED ORDER — METHOCARBAMOL 500 MG PO TABS
500.0000 mg | ORAL_TABLET | Freq: Once | ORAL | Status: AC
  Administered 2020-02-13: 05:00:00 500 mg via ORAL
  Filled 2020-02-13: qty 1

## 2020-02-13 MED ORDER — SODIUM CHLORIDE 0.9 % IV BOLUS
1000.0000 mL | Freq: Once | INTRAVENOUS | Status: AC
  Administered 2020-02-13: 05:00:00 1000 mL via INTRAVENOUS

## 2020-02-13 MED ORDER — ACETAMINOPHEN 500 MG PO TABS
1000.0000 mg | ORAL_TABLET | Freq: Once | ORAL | Status: AC
  Administered 2020-02-13: 1000 mg via ORAL
  Filled 2020-02-13: qty 2

## 2020-02-13 MED ORDER — SODIUM CHLORIDE 0.9 % IV BOLUS
1000.0000 mL | Freq: Once | INTRAVENOUS | Status: AC
  Administered 2020-02-13: 02:00:00 1000 mL via INTRAVENOUS

## 2020-02-13 MED ORDER — POTASSIUM CHLORIDE CRYS ER 20 MEQ PO TBCR
40.0000 meq | EXTENDED_RELEASE_TABLET | Freq: Once | ORAL | Status: AC
  Administered 2020-02-13: 04:00:00 40 meq via ORAL
  Filled 2020-02-13: qty 2

## 2020-02-13 MED ORDER — MAGNESIUM SULFATE 2 GM/50ML IV SOLN
2.0000 g | Freq: Once | INTRAVENOUS | Status: AC
  Administered 2020-02-13: 2 g via INTRAVENOUS
  Filled 2020-02-13: qty 50

## 2020-02-13 MED ORDER — ONDANSETRON HCL 4 MG PO TABS: 4.0000 mg | ORAL_TABLET | Freq: Three times a day (TID) | ORAL | 0 refills | Status: DC | PRN

## 2020-02-13 NOTE — ED Triage Notes (Signed)
Pt received her second covid vaccination at 2 pm yesterday (Friday) started vomiting this am, states she passed out in the bathroom.   Pt denies pain

## 2020-02-13 NOTE — Discharge Instructions (Addendum)
Drink plenty of fluids.  Take the potassium pills until gone.  Get calcium citrate with vitamin D3 and try to take at least 1200 mg of calcium a day.  You received 2 g of IV magnesium today so that should be corrected today.  Please let either your primary care doctor or your GYN doctor know about your low potassium, calcium, and magnesium.  I also sent some blood testing that will not be back for a couple days, a parathyroid test and a vitamin D level which your doctors can check and you can also see in my chart.  Use the Zofran if needed if your nausea returns.  You can take ibuprofen 600 mg and/or acetaminophen 650 mg every 6 hours as needed for body aches or pain at the vaccine injection site.  You can also put a ice pack on your sore arm for comfort.  When you are feeling better I would fill out the vaccine side effect form online.  Recheck if you get worse again.

## 2020-02-13 NOTE — ED Provider Notes (Addendum)
[facility] EMERGENCY DEPARTMENT Provider Note   CSN: [id number] Arrival date & time: 02/13/20  0138   Time seen 1:50 AM  History Chief Complaint  Patient presents with  . Emesis    Cynthia Farmer is a 34 y.o. female.  HPI   Patient states she took the first Materna Covid vaccine on March 4 and she had tingling and lower pelvic cramping.  She has discussed this with her GYN, Dr. Charlotta Newton, and she is scheduled to have a pelvic ultrasound this coming week.  She got her second vaccine at 2 PM on April 9.  At 1230 this morning she woke up feeling very nauseated.  She went into the bathroom and she felt like she was going to pass out because she got very lightheaded and she laid down on the floor and then she did pass out.  She denied loss of vision or ringing of her ears prior to passing out. She states her husband was there but he did not tell her how long she was out.  She then vomited and then she vomited once after arrival to the emergency department. She denies diarrhea or abdominal pain with this episode.  She has no injury because she did not fall.  She states she has been shaking and having chills.  She denies being around anybody else is sick, she denies eating anything different that could have made her sick.  Patient is G1, P1 Ab0, last normal period was 3 weeks ago.  PCP Deatra James, MD GYN Dr Charlotta Newton  Past Medical History:  Diagnosis Date  . Hx of varicella   . Medical history non-contributory     Patient Active Problem List   Diagnosis Date Noted  . Vaginal delivery 08/09/2014  . Third degree perineal laceration 08/09/2014    Past Surgical History:  Procedure Laterality Date  . stitches post-3rd degree tear during child birth 2015  2015     OB History    Gravida  2   Para  1   Term  1   Preterm      AB      Living  1     SAB      TAB      Ectopic      Multiple      Live Births  1           Family History  Problem Relation Age of Onset  .  Heart disease Father   . Heart disease Maternal Grandfather   . Heart disease Paternal Grandfather     Social History   Tobacco Use  . Smoking status: Never Smoker  . Smokeless tobacco: Never Used  Substance Use Topics  . Alcohol use: No  . Drug use: No  lives with spouse  Home Medications Prior to Admission medications   Medication Sig Start Date End Date Taking? Authorizing Provider  docusate sodium (COLACE) 100 MG capsule Take 1 capsule (100 mg total) by mouth 2 (two) times daily. 08/10/14   Myna Hidalgo, DO  DRYSOL 20 % external solution  12/11/16   [provider]  ibuprofen (ADVIL,MOTRIN) 600 MG tablet Take 1 tablet (600 mg total) by mouth every 6 (six) hours. 08/10/14   Myna Hidalgo, DO  NORTREL 1/35, 28, tablet  12/12/16   [provider]  ondansetron (ZOFRAN) 4 MG tablet Take 1 tablet (4 mg total) by mouth every 8 (eight) hours as needed for nausea or vomiting. 02/13/20   Devoria Albe, MD  potassium chloride SA (KLOR-CON) 20 MEQ tablet Take 1 tablet (20 mEq total) by mouth 2 (two) times daily. 02/13/20   Devoria Albe, MD  Prenatal Vit-Fe Fumarate-FA (PRENATAL MULTIVITAMIN) TABS tablet Take 1 tablet by mouth daily at 12 noon.    [provider]    Allergies    Azithromycin, Ceclor [cefaclor], and Amoxicillin  Review of Systems   Review of Systems  All other systems reviewed and are negative.   Physical Exam Updated Vital Signs BP 109/67   Pulse 87   Temp 97.9 F (36.6 C) (Oral)   Resp 18   Ht 5\' 7"  (1.702 m)   Wt 63.5 kg   LMP 01/17/2020 (Approximate)   SpO2 100%   BMI 21.93 kg/m   Physical Exam Vitals and nursing note reviewed.  Constitutional:      Appearance: Normal appearance. She is normal weight.  HENT:     Head: Normocephalic and atraumatic.     Right Ear: External ear normal.     Left Ear: External ear normal.     Mouth/Throat:     Mouth: Mucous membranes are dry.  Eyes:     Extraocular Movements: Extraocular movements  intact.     Conjunctiva/sclera: Conjunctivae normal.     Pupils: Pupils are equal, round, and reactive to light.  Cardiovascular:     Rate and Rhythm: Normal rate and regular rhythm.     Pulses: Normal pulses.     Heart sounds: Normal heart sounds.  Pulmonary:     Effort: Pulmonary effort is normal.     Breath sounds: Normal breath sounds.  Abdominal:     General: Bowel sounds are normal.  Musculoskeletal:        General: Normal range of motion.  Skin:    General: Skin is warm and dry.  Neurological:     General: No focal deficit present.     Mental Status: She is alert and oriented to person, place, and time.     Cranial Nerves: No cranial nerve deficit.  Psychiatric:        Mood and Affect: Mood normal.        Behavior: Behavior normal.        Thought Content: Thought content normal.    Orthostatic VS for the past 24 hrs:  BP- Lying Pulse- Lying BP- Sitting Pulse- Sitting BP- Standing at 0 minutes Pulse- Standing at 0 minutes  02/13/20 0221 111/69 93 113/83 98 122/73 93   Orthostatic VS normal  ED Results / Procedures / Treatments   Labs (all labs ordered are listed, but only abnormal results are displayed) Results for orders placed or performed during the hospital encounter of 02/13/20  Comprehensive metabolic panel  Result Value Ref Range   Sodium 136 135 - 145 mmol/L   Potassium 3.0 (L) 3.5 - 5.1 mmol/L   Chloride 108 98 - 111 mmol/L   CO2 22 22 - 32 mmol/L   Glucose, Bld 106 (H) 70 - 99 mg/dL   BUN 10 6 - 20 mg/dL   Creatinine, Ser 04/14/20 0.44 - 1.00 mg/dL   Calcium 7.8 (L) 8.9 - 10.3 mg/dL   Total Protein 6.4 (L) 6.5 - 8.1 g/dL   Albumin 3.5 3.5 - 5.0 g/dL   AST 20 15 - 41 U/L   ALT 27 0 - 44 U/L   Alkaline Phosphatase 54 38 - 126 U/L   Total Bilirubin 0.5 0.3 - 1.2 mg/dL   GFR calc non Af Amer >60 >60 mL/min  GFR calc Af Amer >60 >60 mL/min   Anion gap 6 5 - 15  CBC with Differential  Result Value Ref Range   WBC 9.8 4.0 - 10.5 K/uL   RBC 4.11 3.87 -  5.11 MIL/uL   Hemoglobin 12.1 12.0 - 15.0 g/dL   HCT 35.8 (L) 36.0 - 46.0 %   MCV 87.1 80.0 - 100.0 fL   MCH 29.4 26.0 - 34.0 pg   MCHC 33.8 30.0 - 36.0 g/dL   RDW 12.1 11.5 - 15.5 %   Platelets 261 150 - 400 K/uL   nRBC 0.0 0.0 - 0.2 %   Neutrophils Relative % 76 %   Neutro Abs 7.4 1.7 - 7.7 K/uL   Lymphocytes Relative 16 %   Lymphs Abs 1.6 0.7 - 4.0 K/uL   Monocytes Relative 8 %   Monocytes Absolute 0.8 0.1 - 1.0 K/uL   Eosinophils Relative 0 %   Eosinophils Absolute 0.0 0.0 - 0.5 K/uL   Basophils Relative 0 %   Basophils Absolute 0.0 0.0 - 0.1 K/uL   Immature Granulocytes 0 %   Abs Immature Granulocytes 0.02 0.00 - 0.07 K/uL  Lipase, blood  Result Value Ref Range   Lipase 34 11 - 51 U/L  Magnesium  Result Value Ref Range   Magnesium 1.6 (L) 1.7 - 2.4 mg/dL  Phosphorus  Result Value Ref Range   Phosphorus 1.9 (L) 2.5 - 4.6 mg/dL   Laboratory interpretation all normal except hypokalemia, hypocalemia, low magnesium, low phosphorus    EKG EKG Interpretation  Date/Time:  Saturday February 13 2020 02:17:01 EDT Ventricular Rate:  95 PR Interval:    QRS Duration: 73 QT Interval:  364 QTC Calculation: 458 R Axis:   74 Text Interpretation: Sinus rhythm Prolonged PR interval Probable left atrial enlargement No old tracing to compare Confirmed by Rolland Porter [phone]) on 02/13/2020 2:37:42 AM   Radiology No results found.  Procedures Procedures (including critical care time)  Medications Ordered in ED Medications  magnesium sulfate IVPB 2 g 50 mL (2 g Intravenous New Bag/Given 02/13/20 0641)  sodium chloride 0.9 % bolus 1,000 mL (0 mLs Intravenous Stopped 02/13/20 0432)  sodium chloride 0.9 % bolus 1,000 mL (0 mLs Intravenous Stopped 02/13/20 0432)  ondansetron (ZOFRAN) injection 4 mg (4 mg Intravenous Given 02/13/20 0210)  acetaminophen (TYLENOL) tablet 1,000 mg (1,000 mg Oral Given 02/13/20 0407)  potassium chloride SA (KLOR-CON) CR tablet 40 mEq (40 mEq Oral Given 02/13/20  0423)  sodium chloride 0.9 % bolus 1,000 mL (0 mLs Intravenous Stopped 02/13/20 0542)  methocarbamol (ROBAXIN) tablet 500 mg (500 mg Oral Given 02/13/20 0434)    ED Course  I have reviewed the triage vital signs and the nursing notes.  Pertinent labs & imaging results that were available during my care of the patient were reviewed by me and considered in my medical decision making (see chart for details).    MDM Rules/Calculators/A&P                      Patient was given IV fluids and IV Zofran for nausea.  She is wanting to drink however we discussed trying ice chips first.  Recheck at 3:00 AM patient is resting quietly, she states her nausea is controlled now.  She is getting her IV fluids but does not feel the need to urinate yet.  She does not feel ready to start doing oral fluid challenge yet.   Recheck 4:10 AM patient states her  nausea is gone, she was complaining of pain in her arm from her vaccine and she was given acetaminophen.  She states she has had good urinary output after the 2 L of IV fluids.  She states her tongue still feels dry.  She is requesting something for her sore arm from the vaccine and was given her choice of acetaminophen.  Patient states she is having some muscle spasms, we discussed her potassium and calcium were low, she was given oral potassium.  Other than the vomiting twice tonight she states she has been exercising recently although she does not sweat a lot.  We discussed using sports drinks when she is exercising and she will be discharged home with potassium pills and she should take a calcium supplement.  Recheck at 5:12 AM patient is sleeping, when she is awake and see states she feels like she has a heavy weight on her extremities and her body.  We discussed filling out the vaccine side effect form when she is feeling better.  Her cramping is gone.  Lab is here to draw the extra blood needed for the extra testing to evaluate her hypocalcemia.  Still  waiting for her magnesium level to be done.  She is getting her third liter of IV fluids.  Recheck at 6:15 AM patient's magnesium has resulted and is mildly low.  She was given 2 g IV.  This was discussed with the patient.  She did finally looks like she is feeling better.  She no longer has nausea.  She has had 3 L of IV fluids.  She does relate she has been having "crying spells" recently due to the world situation and she is wondering if that is what caused her electrolyte problem.  I suspect may be that with her not eating and drinking well with everything that is upsetting her may contribute.  We discussed taking potassium pills and taking calcium citrate with vitamin D when she leaves and to let her doctors know to recheck these values in the next several weeks or months.  We also discussed reporting her side effects to the vaccine on the vaccine website.   Patient will be discharged once her IV magnesium has infused.  Final Clinical Impression(s) / ED Diagnoses Final diagnoses:  Non-intractable vomiting with nausea, unspecified vomiting type  Hypokalemia  Hypomagnesemia  Hypocalcemia  Side effects of vaccination, initial encounter  Syncope, unspecified syncope type    Rx / DC Orders ED Discharge Orders         Ordered    potassium chloride SA (KLOR-CON) 20 MEQ tablet  2 times daily     02/13/20 0623    ondansetron (ZOFRAN) 4 MG tablet  Every 8 hours PRN     02/13/20 0624        OTC calcium citrate with Vit D  Plan discharge  Devoria Albe, MD, Concha Pyo, MD 02/13/20 [id number]    Devoria Albe, MD 02/13/20 0932    Devoria Albe, MD 02/13/20 [phone]

## 2020-02-14 LAB — PARATHYROID HORMONE, INTACT (NO CA): PTH: 45 pg/mL (ref 15–65)

## 2020-02-29 DIAGNOSIS — T50B95A Adverse effect of other viral vaccines, initial encounter: Secondary | ICD-10-CM | POA: Diagnosis not present

## 2020-02-29 DIAGNOSIS — E559 Vitamin D deficiency, unspecified: Secondary | ICD-10-CM | POA: Diagnosis not present

## 2020-04-08 DIAGNOSIS — E559 Vitamin D deficiency, unspecified: Secondary | ICD-10-CM | POA: Diagnosis not present

## 2020-04-08 DIAGNOSIS — R202 Paresthesia of skin: Secondary | ICD-10-CM | POA: Diagnosis not present

## 2020-06-07 DIAGNOSIS — E559 Vitamin D deficiency, unspecified: Secondary | ICD-10-CM | POA: Diagnosis not present

## 2020-06-07 DIAGNOSIS — Z13220 Encounter for screening for lipoid disorders: Secondary | ICD-10-CM | POA: Diagnosis not present

## 2020-06-07 DIAGNOSIS — Z0000 Encounter for general adult medical examination without abnormal findings: Secondary | ICD-10-CM | POA: Diagnosis not present

## 2020-07-06 DIAGNOSIS — M79641 Pain in right hand: Secondary | ICD-10-CM | POA: Diagnosis not present

## 2020-07-21 ENCOUNTER — Other Ambulatory Visit: Payer: Self-pay

## 2020-07-21 ENCOUNTER — Telehealth: Payer: Self-pay | Admitting: Nurse Practitioner

## 2020-07-21 ENCOUNTER — Ambulatory Visit: Payer: Self-pay

## 2020-07-21 DIAGNOSIS — R519 Headache, unspecified: Secondary | ICD-10-CM

## 2020-07-21 DIAGNOSIS — Z20822 Contact with and (suspected) exposure to covid-19: Secondary | ICD-10-CM

## 2020-07-21 LAB — POC COVID19 BINAXNOW: SARS Coronavirus 2 Ag: NEGATIVE

## 2020-07-21 NOTE — Progress Notes (Signed)
   Subjective:    Patient ID: Cynthia Farmer, female    DOB: 1986/02/23, 34 y.o.   MRN: [id number]  HPI   34 year old female with a new onset headache that was present yesterday for about 2 hours yesterday at 4pm. Pain was on the left side of her head only, she has since that time had intermittent pain concentrated to the left side of her head. She also feels there might be some pressure on that side of her head. She feels as thought she may have had some nausea this morning when she woke up. Denies a history of HA or migraine.   She has been having some new onset pain in her hand went to Ortho and was given a brace and some ibuprofen for relief. She has been taking that for 2 weeks around the clock. Takes her ibuprofen at 7am, 1pm and 7pm with food.   Her husband has been experiencing a headache with some URI symptoms since 07/08/20. 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 Has requested to continue to work remote as she is able to perform her job functions remotely.   Review of Systems  Constitutional: Negative.   Neurological: Positive for headaches.  Psychiatric/Behavioral: The patient is nervous/anxious.        Objective:   Physical Exam        Assessment & Plan:  Visit was virtual after patient had Rapid COVID in the parking lot by RN, rapid COVID negative. Risk minimal, since husband has pending PCR and patient requests PCR will send out for confirmation.   Advised to slowly stop ibuprofen as it is not helping her symptoms and HA may be from rebound off NSAID. May use low doses of tylenol if HA onsets with ibuprofen withdrawal as discussed. May restart ibuprofen if hand pain worsens- so far with two weeks of Ibuprofen she has had no improvement in hand pain has f/u with Ortho next week.   She requests a medical evaluation for anxiety based on returning to in person work. Advised f/u next week or f/u with PCP to  further discuss this since she is not a Fac/Staff primary care patient.    Will f/u with PCR results when available, continue to mask when out of private space

## 2020-07-24 ENCOUNTER — Encounter: Payer: Self-pay | Admitting: Nurse Practitioner

## 2020-07-24 LAB — NOVEL CORONAVIRUS, NAA

## 2020-07-25 ENCOUNTER — Encounter: Payer: Self-pay | Admitting: Registered Nurse

## 2020-07-25 ENCOUNTER — Ambulatory Visit: Payer: BC Managed Care – PPO | Admitting: *Deleted

## 2020-07-25 ENCOUNTER — Other Ambulatory Visit: Payer: Self-pay

## 2020-07-25 ENCOUNTER — Telehealth: Payer: BC Managed Care – PPO | Admitting: Registered Nurse

## 2020-07-25 DIAGNOSIS — Z20822 Contact with and (suspected) exposure to covid-19: Secondary | ICD-10-CM

## 2020-07-25 DIAGNOSIS — R519 Headache, unspecified: Secondary | ICD-10-CM

## 2020-07-25 NOTE — Progress Notes (Signed)
Covid PCR specimen collected and sent to [facility].

## 2020-07-25 NOTE — Patient Instructions (Signed)
COVID-19: What Your Test Results Mean If you test positive for COVID-19 Take steps to help prevent the spread of COVID-19 Stay home.  Do not leave your home, except to get medical care. Do not visit public areas. Get rest and stay hydrated. Take over-the-counter medicines, such as acetaminophen, to help you feel better. Stay in touch with your doctor. Separate yourself from other people.  As much as possible, stay in a specific room and away from other people and pets in your home. If you test negative for COVID-19  You probably were not infected at the time your sample was collected.  However, that does not mean you will not get sick.  It is possible that you were very early in your infection when your sample was collected and that you could test positive later. A negative test result does not mean you won't get sick later. [email or web address] 04/04/2019 This information is not intended to replace advice given to you by your health care provider. Make sure you discuss any questions you have with your health care provider. Document Revised: 10/08/2019 Document Reviewed: 10/08/2019 Elsevier Patient Education  [place]. COVID-19 Frequently Asked Questions COVID-19 (coronavirus disease) is an infection that is caused by a large family of viruses. Some viruses cause illness in people and others cause illness in animals like camels, cats, and bats. In some cases, the viruses that cause illness in animals can spread to humans. Where did the coronavirus come from? In December 2019, [place] told the [facility]) of several cases of lung disease (human respiratory illness). These cases were linked to an open seafood and livestock market in the city of [place]. The link to the seafood and livestock market suggests that the virus may have spread from animals to humans. However, since that first outbreak in December, the virus has also been shown to spread from person to  person. What is the name of the disease and the virus? Disease name Early on, this disease was called novel coronavirus. This is because scientists determined that the disease was caused by a new (novel) respiratory virus. The World Health Organization [facility]) has now named the disease COVID-19, or coronavirus disease. Virus name The virus that causes the disease is called severe acute respiratory syndrome coronavirus 2 (SARS-CoV-2). More information on disease and virus naming World Health Organization [facility]): www.who.int/emergencies/diseases/novel-coronavirus-2019/technical-guidance/naming-the-coronavirus-disease-(covid-2019)-and-the-virus-that-causes-it Who is at risk for complications from coronavirus disease? Some people may be at higher risk for complications from coronavirus disease. This includes older adults and people who have chronic diseases, such as heart disease, diabetes, and lung disease. If you are at higher risk for complications, take these extra precautions:  Stay home as much as possible.  Avoid social gatherings and travel.  Avoid close contact with others. Stay at least 6 ft (2 m) away from others, if possible.  Wash your hands often with soap and water for at least 20 seconds.  Avoid touching your face, mouth, nose, or eyes.  Keep supplies on hand at home, such as food, medicine, and cleaning supplies.  If you must go out in public, wear a cloth face covering or face mask. Make sure your mask covers your nose and mouth. How does coronavirus disease spread? The virus that causes coronavirus disease spreads easily from person to person (is contagious). You may catch the virus by:  Breathing in droplets from an infected person. Droplets can be spread by a person breathing, speaking, singing, coughing, or sneezing.  Touching something, like a  table or a doorknob, that was exposed to the virus (contaminated) and then touching your mouth, nose, or eyes. Can I get the  virus from touching surfaces or objects? There is still a lot that we do not know about the virus that causes coronavirus disease. Scientists are basing a lot of information on what they know about similar viruses, such as:  Viruses cannot generally survive on surfaces for long. They need a human body (host) to survive.  It is more likely that the virus is spread by close contact with people who are sick (direct contact), such as through: ? Shaking hands or hugging. ? Breathing in respiratory droplets that travel through the air. Droplets can be spread by a person breathing, speaking, singing, coughing, or sneezing.  It is less likely that the virus is spread when a person touches a surface or object that has the virus on it (indirect contact). The virus may be able to enter the body if the person touches a surface or object and then touches his or her face, eyes, nose, or mouth. Can a person spread the virus without having symptoms of the disease? It may be possible for the virus to spread before a person has symptoms of the disease, but this is most likely not the main way the virus is spreading. It is more likely for the virus to spread by being in close contact with people who are sick and breathing in the respiratory droplets spread by a person breathing, speaking, singing, coughing, or sneezing. What are the symptoms of coronavirus disease? Symptoms vary from person to person and can range from mild to severe. Symptoms may include:  Fever or chills.  Cough.  Difficulty breathing or feeling short of breath.  Headaches, body aches, or muscle aches.  Runny or stuffy (congested) nose.  Sore throat.  New loss of taste or smell.  Nausea, vomiting, or diarrhea. These symptoms can appear anywhere from 2 to 14 days after you have been exposed to the virus. Some people may not have any symptoms. If you develop symptoms, call your health care provider. People with severe symptoms may need  hospital care. Should I be tested for this virus? Your health care provider will decide whether to test you based on your symptoms, history of exposure, and your risk factors. How does a health care provider test for this virus? Health care providers will collect samples to send for testing. Samples may include:  Taking a swab of fluid from the back of your nose and throat, your nose, or your throat.  Taking fluid from the lungs by having you cough up mucus (sputum) into a sterile cup.  Taking a blood sample. Is there a treatment or vaccine for this virus? Currently, there is no vaccine to prevent coronavirus disease. Also, there are no medicines like antibiotics or antivirals to treat the virus. A person who becomes sick is given supportive care, which means rest and fluids. A person may also relieve his or her symptoms by using over-the-counter medicines that treat sneezing, coughing, and runny nose. These are the same medicines that a person takes for the common cold. If you develop symptoms, call your health care provider. People with severe symptoms may need hospital care. What can I do to protect myself and my family from this virus?     You can protect yourself and your family by taking the same actions that you would take to prevent the spread of other viruses. Take the following  actions:  Wash your hands often with soap and water for at least 20 seconds. If soap and water are not available, use alcohol-based hand sanitizer.  Avoid touching your face, mouth, nose, or eyes.  Cough or sneeze into a tissue, sleeve, or elbow. Do not cough or sneeze into your hand or the air. ? If you cough or sneeze into a tissue, throw it away immediately and wash your hands.  Disinfect objects and surfaces that you frequently touch every day.  Stay away from people who are sick.  Avoid going out in public, follow guidance from your state and local health authorities.  Avoid crowded indoor  spaces. Stay at least 6 ft (2 m) away from others.  If you must go out in public, wear a cloth face covering or face mask. Make sure your mask covers your nose and mouth.  Stay home if you are sick, except to get medical care. Call your health care provider before you get medical care. Your health care provider will tell you how long to stay home.  Make sure your vaccines are up to date. Ask your health care provider what vaccines you need. What should I do if I need to travel? Follow travel recommendations from your local health authority, the CDC, and WHO. Travel information and advice  Centers for Disease Control and Prevention (CDC): BodyEditor.hu  World Health Organization [facility]): ThirdIncome.ca Know the risks and take action to protect your health  You are at higher risk of getting coronavirus disease if you are traveling to areas with an outbreak or if you are exposed to travelers from areas with an outbreak.  Wash your hands often and practice good hygiene to lower the risk of catching or spreading the virus. What should I do if I am sick? General instructions to stop the spread of infection  Wash your hands often with soap and water for at least 20 seconds. If soap and water are not available, use alcohol-based hand sanitizer.  Cough or sneeze into a tissue, sleeve, or elbow. Do not cough or sneeze into your hand or the air.  If you cough or sneeze into a tissue, throw it away immediately and wash your hands.  Stay home unless you must get medical care. Call your health care provider or local health authority before you get medical care.  Avoid public areas. Do not take public transportation, if possible.  If you can, wear a mask if you must go out of the house or if you are in close contact with someone who is not sick. Make sure your mask covers your nose and mouth. Keep your  home clean  Disinfect objects and surfaces that are frequently touched every day. This may include: ? Counters and tables. ? Doorknobs and light switches. ? Sinks and faucets. ? Electronics such as phones, remote controls, keyboards, computers, and tablets.  Wash dishes in hot, soapy water or use a dishwasher. Air-dry your dishes.  Wash laundry in hot water. Prevent infecting other household members  Let healthy household members care for children and pets, if possible. If you have to care for children or pets, wash your hands often and wear a mask.  Sleep in a different bedroom or bed, if possible.  Do not share personal items, such as razors, toothbrushes, deodorant, combs, brushes, towels, and washcloths. Where to find more information Centers for Disease Control and Prevention (CDC)  Information and news updates: https://www.butler-gonzalez.com/ World Health Organization [facility])  Information and news updates:  AffordableSalon.es  Coronavirus health topic: https://thompson-craig.com/  Questions and answers on COVID-19: kruiseway.com  Global tracker: who.sprinklr.com American Academy of Pediatrics (AAP)  Information for families: www.healthychildren.org/English/health-issues/conditions/chest-lungs/Pages/2019-Novel-Coronavirus.aspx The coronavirus situation is changing rapidly. Check your local health authority website or the CDC and [facility] websites for updates and news. When should I contact a health care provider?  Contact your health care provider if you have symptoms of an infection, such as fever or cough, and you: ? Have been near anyone who is known to have coronavirus disease. ? Have come into contact with a person who is suspected to have coronavirus disease. ? Have traveled to an area where there is an outbreak of COVID-19. When should I get emergency medical care?  Get help right away  by calling your local emergency services (911 in the [place] if you have: ? Trouble breathing. ? Pain or pressure in your chest. ? Confusion. ? Blue-tinged lips and fingernails. ? Difficulty waking from sleep. ? Symptoms that get worse. Let the emergency medical personnel know if you think you have coronavirus disease. Summary  A new respiratory virus is spreading from person to person and causing COVID-19 (coronavirus disease).  The virus that causes COVID-19 appears to spread easily. It spreads from one person to another through droplets from breathing, speaking, singing, coughing, or sneezing.  Older adults and those with chronic diseases are at higher risk of disease. If you are at higher risk for complications, take extra precautions.  There is currently no vaccine to prevent coronavirus disease. There are no medicines, such as antibiotics or antivirals, to treat the virus.  You can protect yourself and your family by washing your hands often, avoiding touching your face, and covering your coughs and sneezes. This information is not intended to replace advice given to you by your health care provider. Make sure you discuss any questions you have with your health care provider. Document Revised: 08/21/2019 Document Reviewed: 02/17/2019 Elsevier Patient Education  2020 Elsevier Inc. General Headache Without Cause A headache is pain or discomfort felt around the head or neck area. The specific cause of a headache may not be found. There are many causes and types of headaches. A few common ones are:  Tension headaches.  Migraine headaches.  Cluster headaches.  Chronic daily headaches. Follow these instructions at home: Watch your condition for any changes. Let your health care provider know about them. Take these steps to help with your condition: Managing pain      Take over-the-counter and prescription medicines only as told by your health care provider.  Lie down in a  dark, quiet room when you have a headache.  If directed, put ice on your head and neck area: ? Put ice in a plastic bag. ? Place a towel between your skin and the bag. ? Leave the ice on for 20 minutes, 2-3 times per day.  If directed, apply heat to the affected area. Use the heat source that your health care provider recommends, such as a moist heat pack or a heating pad. ? Place a towel between your skin and the heat source. ? Leave the heat on for 20-30 minutes. ? Remove the heat if your skin turns bright red. This is especially important if you are unable to feel pain, heat, or cold. You may have a greater risk of getting burned.  Keep lights dim if bright lights bother you or make your headaches worse. Eating and drinking  Eat meals on a regular schedule.  If  you drink alcohol: ? Limit how much you use to:  0-1 drink a day for women.  0-2 drinks a day for men. ? Be aware of how much alcohol is in your drink. In the [place] one drink equals one 12 oz bottle of beer (355 mL), one 5 oz glass of wine (148 mL), or one 1 oz glass of hard liquor (44 mL).  Stop drinking caffeine, or decrease the amount of caffeine you drink. General instructions   Keep a headache journal to help find out what may trigger your headaches. For example, write down: ? What you eat and drink. ? How much sleep you get. ? Any change to your diet or medicines.  Try massage or other relaxation techniques.  Limit stress.  Sit up straight, and do not tense your muscles.  Do not use any products that contain nicotine or tobacco, such as cigarettes, e-cigarettes, and chewing tobacco. If you need help quitting, ask your health care provider.  Exercise regularly as told by your health care provider.  Sleep on a regular schedule. Get 7-9 hours of sleep each night, or the amount recommended by your health care provider.  Keep all follow-up visits as told by your health care provider. This is  important. Contact a health care provider if:  Your symptoms are not helped by medicine.  You have a headache that is different from the usual headache.  You have nausea or you vomit.  You have a fever. Get help right away if:  Your headache becomes severe quickly.  Your headache gets worse after moderate to intense physical activity.  You have repeated vomiting.  You have a stiff neck.  You have a loss of vision.  You have problems with speech.  You have pain in the eye or ear.  You have muscular weakness or loss of muscle control.  You lose your balance or have trouble walking.  You feel faint or pass out.  You have confusion.  You have a seizure. Summary  A headache is pain or discomfort felt around the head or neck area.  There are many causes and types of headaches. In some cases, the cause may not be found.  Keep a headache journal to help find out what may trigger your headaches. Watch your condition for any changes. Let your health care provider know about them.  Contact a health care provider if you have a headache that is different from the usual headache, or if your symptoms are not helped by medicine.  Get help right away if your headache becomes severe, you vomit, you have a loss of vision, you lose your balance, or you have a seizure. This information is not intended to replace advice given to you by your health care provider. Make sure you discuss any questions you have with your health care provider. Document Revised: 05/12/2018 Document Reviewed: 05/12/2018 Elsevier Patient Education  2020 Elsevier Inc. Form - Headache Record There are many types and causes of headaches. A headache record can help guide your treatment plan. Use this form to record the details. Bring this form with you to your follow-up visits. Follow your health care provider's instructions on how to describe your headache. You may be asked to:  Use a pain scale. This is a tool  to rate the intensity of your headache using words or numbers.  Describe what your headache feels like, such as dull, achy, throbbing, or sharp. Headache record Date: _______________ Time (from start to end): ____________________  Location of the headache: _________________________  Intensity of the headache: ____________________ Description of the headache: ______________________________________________________________  Hours of sleep the night before the headache: __________  Food or drinks before the headache started: ______________________________________________________________________________________  Events before the headache started: _______________________________________________________________________________________________  Symptoms before the headache started: __________________________________________________________________________________________  Symptoms during the headache: __________________________________________________________________________________________________  Treatment: ________________________________________________________________________________________________________________  Effect of treatment: _________________________________________________________________________________________________________  Other comments: ___________________________________________________________________________________________________________ Date: _______________ Time (from start to end): ____________________ Location of the headache: _________________________  Intensity of the headache: ____________________ Description of the headache: ______________________________________________________________  Hours of sleep the night before the headache: __________  Food or drinks before the headache started: ______________________________________________________________________________________  Events before the headache started:  ____________________________________________________________________________________________  Symptoms before the headache started: _________________________________________________________________________________________  Symptoms during the headache: _______________________________________________________________________________________________  Treatment: ________________________________________________________________________________________________________________  Effect of treatment: _________________________________________________________________________________________________________  Other comments: ___________________________________________________________________________________________________________ Date: _______________ Time (from start to end): ____________________ Location of the headache: _________________________  Intensity of the headache: ____________________ Description of the headache: ______________________________________________________________  Hours of sleep the night before the headache: __________  Food or drinks before the headache started: ______________________________________________________________________________________  Events before the headache started: ____________________________________________________________________________________________  Symptoms before the headache started: _________________________________________________________________________________________  Symptoms during the headache: _______________________________________________________________________________________________  Treatment: ________________________________________________________________________________________________________________  Effect of treatment: _________________________________________________________________________________________________________  Other comments:  ___________________________________________________________________________________________________________ Date: _______________ Time (from start to end): ____________________ Location of the headache: _________________________  Intensity of the headache: ____________________ Description of the headache: ______________________________________________________________  Hours of sleep the night before the headache: _________  Food or drinks before the headache started: ______________________________________________________________________________________  Events before the headache started: ____________________________________________________________________________________________  Symptoms before the headache started: _________________________________________________________________________________________  Symptoms during the headache: _______________________________________________________________________________________________  Treatment: ________________________________________________________________________________________________________________  Effect of treatment: _________________________________________________________________________________________________________  Other comments: ___________________________________________________________________________________________________________ Date: _______________ Time (from start to end): ____________________ Location of the headache: _________________________  Intensity of the headache: ____________________ Description of the headache: ______________________________________________________________  Hours of sleep the night before the headache: _________  Food or drinks before the headache started: ______________________________________________________________________________________  Events before the headache started: ____________________________________________________________________________________________  Symptoms  before the headache started: _________________________________________________________________________________________  Symptoms during the headache: _______________________________________________________________________________________________  Treatment: ________________________________________________________________________________________________________________  Effect of treatment: _________________________________________________________________________________________________________  Other comments: ___________________________________________________________________________________________________________ This information is not intended to replace advice given to you by your health care provider. Make sure you discuss any questions you have with your health care provider. Document Revised: 11/10/2018 Document Reviewed: 11/10/2018 Elsevier Patient Education  2020 Elsevier Inc. Tension Headache, Adult A tension headache is a feeling of pain, pressure, or aching in the head that is often felt over the front and sides of the head. The pain can be dull, or it can feel tight (constricting). There are two types of tension headache:  Episodic tension headache. This is when the headaches happen fewer than 15 days a month.  Chronic tension headache. This is when the headaches happen more than 15 days a month during a 4474-month period. A tension headache can last from 30 minutes to several days. It is the most common kind of headache. Tension headaches are not normally associated with nausea or vomiting, and they do not get worse with physical activity. What are the causes? The exact cause of this condition is not known. Tension headaches are often triggered by stress, anxiety, or depression. Other triggers include:  Alcohol.  Too much caffeine or caffeine withdrawal.  Respiratory infections, such as colds, flu, or sinus infections.  Dental problems or teeth clenching.  Tiredness  (fatigue).  Holding your head and neck in the same position for a  long period of time, such as while using a computer.  Smoking.  Arthritis of the neck. What are the signs or symptoms? Symptoms of this condition include:  A feeling of pressure or tightness around the head.  Dull, aching head pain.  Pain over the front and sides of the head.  Tenderness in the muscles of the head, neck, and shoulders. How is this diagnosed? This condition may be diagnosed based on your symptoms, your medical history, and a physical exam. If your symptoms are severe or unusual, you may have imaging tests, such as a CT scan or an MRI of your head. Your vision may also be checked. How is this treated? This condition may be treated with lifestyle changes and with medicines that help relieve symptoms. Follow these instructions at home: Managing pain  Take over-the-counter and prescription medicines only as told by your health care provider.  When you have a headache, lie down in a dark, quiet room.  If directed, apply ice to the head and neck: ? Put ice in a plastic bag. ? Place a towel between your skin and the bag. ? Leave the ice on for 20 minutes, 2-3 times a day.  If directed, apply heat to the back of your neck as often as told by your health care provider. Use the heat source that your health care provider recommends, such as a moist heat pack or a heating pad. ? Place a towel between your skin and the heat source. ? Leave the heat on for 20-30 minutes. ? Remove the heat if your skin turns bright red. This is especially important if you are unable to feel pain, heat, or cold. You may have a greater risk of getting burned. Eating and drinking  Eat meals on a regular schedule.  Limit alcohol intake to no more than 1 drink a day for nonpregnant women and 2 drinks a day for men. One drink equals 12 oz of beer, 5 oz of wine, or 1 oz of hard liquor.  Drink enough fluid to keep your urine pale  yellow.  Decrease your caffeine intake, or stop using caffeine. Lifestyle  Get 7-9 hours of sleep each night, or get the amount of sleep recommended by your health care provider.  At bedtime, remove all electronic devices from your room. Electronic devices include computers, phones, and tablets.  Find ways to manage your stress. Some things that can help relieve stress include: ? Exercise. ? Deep breathing exercises. ? Yoga. ? Listening to music. ? Positive mental imagery.  Try to sit up straight and avoid tensing your muscles.  Do not use any products that contain nicotine or tobacco, such as cigarettes and e-cigarettes. If you need help quitting, ask your health care provider. General instructions   Keep all follow-up visits as told by your health care provider. This is important.  Avoid any headache triggers. Keep a headache journal to help find out what may trigger your headaches. For example, write down: ? What you eat and drink. ? How much sleep you get. ? Any change to your diet or medicines. Contact a health care provider if:  Your headache does not get better.  Your headache comes back.  You are sensitive to sounds, light, or smells because of a headache.  You have nausea or you vomit.  Your stomach hurts. Get help right away if:  You suddenly develop a very severe headache along with any of the following: ? A stiff neck. ? Nausea and  vomiting. ? Confusion. ? Weakness. ? Double vision or loss of vision. ? Shortness of breath. ? Rash. ? Unusual sleepiness. ? Fever. ? Trouble speaking. ? Pain in your eyes or ears. ? Trouble walking or balancing. ? Feeling faint or passing out. Summary  A tension headache is a feeling of pain, pressure, or aching in the head that is often felt over the front and sides of the head.  A tension headache can last from 30 minutes to several days. It is the most common kind of headache.  This condition may be diagnosed  based on your symptoms, your medical history, and a physical exam.  This condition may be treated with lifestyle changes and with medicines that help relieve symptoms. This information is not intended to replace advice given to you by your health care provider. Make sure you discuss any questions you have with your health care provider. Document Revised: 08/19/2019 Document Reviewed: 02/01/2017 Elsevier Patient Education  2020 [employer].

## 2020-07-25 NOTE — Progress Notes (Signed)
Subjective:    Patient ID: Cynthia Farmer, female    DOB: [birth year], 34 y.o.   MRN: [id number]  34y/o established caucasian female patient here for repeat covid testing as headache continues and covid testing last week indeterminate results.  Spouse test negative.  Has incorporated medication recommendations from NP Parker into her daily routine.  Works at [employer].  Patient consented to telephone visit.  This visit was conducted entirely via telephone/audio.  I spent  7 minutes on the telephone with patient.  Patient had covid testing in clinic parking lot with RN Cozart today looked well skin warm dry and pink A&Ox3 spoke full sentences without difficulty no cough/throat clearing/congestion noted.     Review of Systems  Constitutional: Negative for chills and fever.  HENT: Negative for trouble swallowing and voice change.   Eyes: Negative for discharge and redness.  Respiratory: Negative for cough and shortness of breath.   Cardiovascular: Negative for chest pain.  Gastrointestinal: Negative for diarrhea and vomiting.  Musculoskeletal: Negative for gait problem.  Skin: Negative for rash.  Allergic/Immunologic: Negative for food allergies.  Neurological: Positive for headaches. Negative for dizziness, tremors, seizures, syncope, facial asymmetry, speech difficulty, weakness, light-headedness and numbness.  Hematological: Does not bruise/bleed easily.  Psychiatric/Behavioral: Negative for agitation and confusion.       Objective:   Physical Exam Nursing note reviewed.  Constitutional:      General: She is awake. She is not in acute distress.    Appearance: Normal appearance. She is well-developed and well-groomed. She is not ill-appearing, toxic-appearing or diaphoretic.  HENT:     Head: Normocephalic and atraumatic.     Jaw: There is normal jaw occlusion.     Right Ear: Hearing and external ear normal.     Left Ear: Hearing and external ear normal.     Nose: Nose  normal. No congestion or rhinorrhea.     Mouth/Throat:     Pharynx: Oropharynx is clear.  Eyes:     General: Lids are normal. Vision grossly intact. Gaze aligned appropriately. No scleral icterus.       Right eye: No discharge.        Left eye: No discharge.     Extraocular Movements: Extraocular movements intact.     Conjunctiva/sclera: Conjunctivae normal.     Pupils: Pupils are equal, round, and reactive to light. Pupils are equal.  Neck:     Trachea: Trachea and phonation normal.  Pulmonary:     Effort: Pulmonary effort is normal. No respiratory distress.     Breath sounds: Normal breath sounds and air entry. No stridor. No wheezing.     Comments: No cough observed or audible on telephone conversation; spoke full sentences without difficulty Musculoskeletal:        General: No swelling. Normal range of motion.     Cervical back: Normal range of motion. No rigidity.  Skin:    General: Skin is warm and dry.     Coloration: Skin is not ashen, cyanotic, jaundiced, mottled, pale or sallow.     Findings: No bruising, erythema, lesion or rash.  Neurological:     General: No focal deficit present.     Mental Status: She is alert and oriented to person, place, and time. Mental status is at baseline.     GCS: GCS eye subscore is 4. GCS verbal subscore is 5. GCS motor subscore is 6.     Cranial Nerves: No dysarthria or facial asymmetry.     Motor:  Motor function is intact. No weakness, tremor, abnormal muscle tone or seizure activity.     Coordination: Coordination is intact. Coordination normal.  Psychiatric:        Attention and Perception: Attention and perception normal.        Mood and Affect: Mood and affect normal. Mood is not anxious or depressed.        Speech: Speech normal.        Behavior: Behavior normal. Behavior is not agitated. Behavior is cooperative.        Cognition and Memory: Cognition and memory normal. Cognition is not impaired. Memory is not impaired.            Assessment & Plan:  A-headache, covid testing  P-May take 2-4 days for PCR test results.  Will upload to mychart once [facility] sends to epic.  Patient will probably see prior to clinic staff.  If positive start home quarantine.  If negative continue covid pandemic safety precautions.   Repeat PCR as initial indeterminate last week.  Day #6 testing today for symptoms primarily headache suspected tension type.  Vaccinated.  May return to work, continue to wear mask when out in public and [employer] policy to wear while at work.  Refer to [employer] if further questions regarding covid prevention policies when on [employer].  Monitor for symptoms of covid e.g. runny nose, sore throat, headache, loss of taste/smell, nausea/vomiting/diarrhea, fever/chills, body aches, cough, shortness of breath/dyspnea. Patient to notify clinic staff if new symptoms develop.  Discussed repeat testing may be indicated if new symptoms develop.  Discussed current research has shown that breakthrough covid infections are occurring with the delta variant but typically do not require hospitalization for fully vaccinated immunocompetent patients.  Discussed with patient covid spread and incidence in community high.  Continue to protect self with mask wear, hand sanitizing washing, avoid touching face and maintaining social distancing 6 feet or greater.  [employer] handouts printed and given on covid FAQ and covid testing to patient.  Patient verbalized understanding of information/instructions, agreed with plan of care and had no further questions at this time.    Patient anxious about returning to work in person due to covid pandemic.  Prefers to work remote from home.  Seek re-evaluation sudden acute worst headache of life (doesn't improve with usual headache remedies), visual changes, dysphagia/dysphasia, extremity/facial weakness (lopsided smile).   For acute pain, rest, and intermittent application of heat,  analgesics, and PRN po NSAIDS. Avoid known headache triggers e.g. sleep deprivation, foods, stress, dehydration. If headache is the worst headache of entire life and came on like a clap of thunder patient was instructed to go to the Emergency Room. Call or return to clinic as needed if these symptoms worsen or fail to improve as anticipated. Exitcare handout on headaches, headache log, tension headache printed and given to patient by RN Cozart and patient also instructed to maintain headache log. Patient verbalized agreement and understanding of treatment plan and had no further questions at this time.

## 2020-07-26 DIAGNOSIS — F411 Generalized anxiety disorder: Secondary | ICD-10-CM | POA: Diagnosis not present

## 2020-07-27 DIAGNOSIS — M79641 Pain in right hand: Secondary | ICD-10-CM | POA: Diagnosis not present

## 2020-07-27 LAB — SARS-COV-2, NAA 2 DAY TAT

## 2020-07-27 LAB — NOVEL CORONAVIRUS, NAA: SARS-CoV-2, NAA: NOT DETECTED

## 2020-07-28 ENCOUNTER — Telehealth: Payer: BC Managed Care – PPO | Admitting: Nurse Practitioner

## 2020-07-28 ENCOUNTER — Ambulatory Visit: Payer: Self-pay | Admitting: Nurse Practitioner

## 2020-07-28 ENCOUNTER — Other Ambulatory Visit: Payer: Self-pay

## 2020-07-28 NOTE — Progress Notes (Signed)
   Subjective:    Patient ID: Cynthia Farmer, female    DOB: Mar 05, 1986, 34 y.o.   MRN: [id number]  HPI 34 year old female that had a virtual appointment one week ago regarding a headache that was intermittent. She had been taking advil for 2 weeks for a hand injury.   She slowly weaned herself off Advil but found after a few days she did have a headache and needed an Advil Saturday night which did give her relief.   Over the weekend she used one Advil on Sunday (4 days ago).  She has continued to get an intermittent headaches throughout the days since that time that are more likely to occur on the left side of her head, though the area of pain is not consistent. Denies pain behind her eyes. Denies pain in facial regions now.    Unsure of if headache is worse in am/pm with screentime/driving at home or work.   Does note that her husband started to experience a headache about a week before her, he also had negative COVID testing and then went to his PCP that told him he likely had another virus, she is unsure if husband is still having HA  She did try tylenol and did not get relief from that  Her hand pain did not change when stopping Advil, she continues to wear a brace to aid with that.     Review of Systems  Constitutional: Negative.   HENT: Negative.   Respiratory: Negative.   Neurological: Positive for headaches.       Objective:   Physical Exam        Assessment & Plan:  Patient preferred telehealth visit today.  Discussed with patient she may continue to use Advil as needed. Start with 200mg  if pain is not relieved may take a second tablet. Start a headache journal over the weekend. Since both her and her husband are experiencing HA onset at the same time this may be viral, may also be caused by an environmental trigger. Discussed the potential for an inhaled irritant in the home.   If both her and her husband continue to experience HA mainly while at home they should  consider contacting gas company  Patient will f/u next week to discuss HA log and progression of HA- will RTC earlier if symptoms worsen.

## 2020-07-30 DIAGNOSIS — M79641 Pain in right hand: Secondary | ICD-10-CM | POA: Diagnosis not present

## 2020-08-03 DIAGNOSIS — M79641 Pain in right hand: Secondary | ICD-10-CM | POA: Diagnosis not present

## 2020-08-09 DIAGNOSIS — F419 Anxiety disorder, unspecified: Secondary | ICD-10-CM | POA: Diagnosis not present

## 2020-08-09 DIAGNOSIS — J069 Acute upper respiratory infection, unspecified: Secondary | ICD-10-CM | POA: Diagnosis not present

## 2020-08-09 DIAGNOSIS — R1032 Left lower quadrant pain: Secondary | ICD-10-CM | POA: Diagnosis not present

## 2020-08-11 ENCOUNTER — Other Ambulatory Visit: Payer: Self-pay | Admitting: Family Medicine

## 2020-08-11 DIAGNOSIS — R1032 Left lower quadrant pain: Secondary | ICD-10-CM

## 2020-08-16 ENCOUNTER — Ambulatory Visit
Admission: RE | Admit: 2020-08-16 | Discharge: 2020-08-16 | Disposition: A | Discharge status: Home / Self Care | Payer: BC Managed Care – PPO | Source: Ambulatory Visit | Attending: Family Medicine | Admitting: Family Medicine

## 2020-08-16 DIAGNOSIS — E282 Polycystic ovarian syndrome: Secondary | ICD-10-CM | POA: Diagnosis not present

## 2020-08-16 DIAGNOSIS — N8302 Follicular cyst of left ovary: Secondary | ICD-10-CM | POA: Diagnosis not present

## 2020-08-16 DIAGNOSIS — N854 Malposition of uterus: Secondary | ICD-10-CM | POA: Diagnosis not present

## 2020-08-16 DIAGNOSIS — R1032 Left lower quadrant pain: Secondary | ICD-10-CM | POA: Diagnosis not present

## 2020-08-18 DIAGNOSIS — M79641 Pain in right hand: Secondary | ICD-10-CM | POA: Diagnosis not present

## 2020-08-18 DIAGNOSIS — S6421XD Injury of radial nerve at wrist and hand level of right arm, subsequent encounter: Secondary | ICD-10-CM | POA: Diagnosis not present

## 2020-08-25 DIAGNOSIS — S6421XD Injury of radial nerve at wrist and hand level of right arm, subsequent encounter: Secondary | ICD-10-CM | POA: Diagnosis not present

## 2020-08-25 DIAGNOSIS — M79641 Pain in right hand: Secondary | ICD-10-CM | POA: Diagnosis not present

## 2020-09-07 DIAGNOSIS — F419 Anxiety disorder, unspecified: Secondary | ICD-10-CM | POA: Diagnosis not present

## 2020-09-07 DIAGNOSIS — R519 Headache, unspecified: Secondary | ICD-10-CM | POA: Diagnosis not present

## 2020-09-08 DIAGNOSIS — D2362 Other benign neoplasm of skin of left upper limb, including shoulder: Secondary | ICD-10-CM | POA: Diagnosis not present

## 2020-09-08 DIAGNOSIS — L309 Dermatitis, unspecified: Secondary | ICD-10-CM | POA: Diagnosis not present

## 2020-09-08 DIAGNOSIS — L853 Xerosis cutis: Secondary | ICD-10-CM | POA: Diagnosis not present

## 2020-09-09 DIAGNOSIS — S6421XD Injury of radial nerve at wrist and hand level of right arm, subsequent encounter: Secondary | ICD-10-CM | POA: Diagnosis not present

## 2020-09-09 DIAGNOSIS — M79641 Pain in right hand: Secondary | ICD-10-CM | POA: Diagnosis not present

## 2020-09-16 DIAGNOSIS — S6421XD Injury of radial nerve at wrist and hand level of right arm, subsequent encounter: Secondary | ICD-10-CM | POA: Diagnosis not present

## 2020-09-16 DIAGNOSIS — M79641 Pain in right hand: Secondary | ICD-10-CM | POA: Diagnosis not present

## 2020-09-23 DIAGNOSIS — S6421XD Injury of radial nerve at wrist and hand level of right arm, subsequent encounter: Secondary | ICD-10-CM | POA: Diagnosis not present

## 2020-09-23 DIAGNOSIS — M79641 Pain in right hand: Secondary | ICD-10-CM | POA: Diagnosis not present

## 2020-10-07 DIAGNOSIS — M79641 Pain in right hand: Secondary | ICD-10-CM | POA: Diagnosis not present

## 2020-10-07 DIAGNOSIS — S6421XD Injury of radial nerve at wrist and hand level of right arm, subsequent encounter: Secondary | ICD-10-CM | POA: Diagnosis not present

## 2020-10-10 DIAGNOSIS — Z20822 Contact with and (suspected) exposure to covid-19: Secondary | ICD-10-CM | POA: Diagnosis not present

## 2020-10-13 ENCOUNTER — Ambulatory Visit: Payer: BC Managed Care – PPO

## 2020-10-13 ENCOUNTER — Other Ambulatory Visit: Payer: Self-pay

## 2020-10-13 ENCOUNTER — Telehealth: Payer: BC Managed Care – PPO | Admitting: Nurse Practitioner

## 2020-10-13 NOTE — Patient Instructions (Addendum)

## 2020-10-13 NOTE — Progress Notes (Signed)
   Subjective:    Patient ID: Cynthia Farmer, female    DOB: Jul 24, 1986, 34 y.o.   MRN: [id number]  HPI  34 year old female presents with complaints of a runny nose with sneezing and nasal congestion that onset 5 days ago (10/08/20). She went for COVID testing 2 days into symptoms. She had a PCR performed 10/10/20 at [facility].   She does note that she had some fatigue earlier last week that was after a massage- she assumed that the fatigue was more related to the massage initially.   She received her test results this am. At this time she has minimal cold symptoms, nasal congestion mainly. She has been vaccinated with Moderna, has not received booster.   She works for [employer], currently has ADA accommodations to work from home.   Denies a history of asthma/   She denies a fever throughout illness.  Has not heard from [facility] yet for contact tracing or isolation release date.   Had plants to travel to [place] 10/22/20.  Review of Systems  Constitutional: Negative for fever.  HENT: Positive for congestion and sneezing.   Cardiovascular: Negative.   Neurological: Negative.    Past Medical History:  Diagnosis Date  . Hx of varicella   . Medical history non-contributory        Objective:   Physical Exam  This was a telehealth appointment. Patient was in no acute distress throughout conversation. Able to speak without SOB.        Assessment & Plan:  Discussed monoclonal antibody infusion options, gave patient [facility] [phone] number if she would like to call nurse line for more information. She will call our clinic back if she would prefer a referral to [facility]. She would like time to think about this option, explained that infusions are only indicated typically within the first week since symptom onset.   Discussed isolation precautions with patient.   Advised OTC management of symptoms with cold/flu medications.   RT work date as of now potentially 10/18/20- unless a  different date is given to patient by [facility]-  Will schedule virtual followup for Monday 10/17/20 to assure patient is improving and is ready to return to work. She will continue to work remotely with her accommodations.

## 2020-10-17 ENCOUNTER — Other Ambulatory Visit: Payer: Self-pay

## 2020-10-17 ENCOUNTER — Telehealth: Payer: BC Managed Care – PPO | Admitting: Medical

## 2020-10-17 NOTE — Progress Notes (Addendum)
   Subjective:    Patient ID: Cynthia Farmer, female    DOB: 04-23-1986, 34 y.o.   MRN: [id number]  HPI 34 yo female in non acute distress consents to telemedicine appointment. Over all feeling better on her  cold symptoms with some fatigue. [facility] has not gotten a hold of the patient yet. No fever or chills.  Initial symptoms were runny nose , congestion, sneezing. She feels all this is improving. Hx of Covid-19 infection diagnosed 10/10/2020.  Allergies  Allergen Reactions  . Azithromycin Itching  . Ceclor [Cefaclor] Swelling  . Amoxicillin Rash    Review of Systems  Constitutional: Positive for fatigue. Negative for chills and unexpected weight change.  HENT: Positive for congestion (better), rhinorrhea (better) and sneezing (better). Negative for ear pain and sore throat.   Respiratory: Negative for cough, shortness of breath and wheezing.   Cardiovascular: Negative for chest pain.  Gastrointestinal: Negative for diarrhea, nausea and vomiting.  Genitourinary: Negative for difficulty urinating. Genital sores:    Musculoskeletal: Negative for myalgias. Neck stiffness: vaccinated with [place]na.  Skin: Negative for color change and rash.  Neurological: Positive for headaches (Saturday only then resolved after sleeping). Negative for dizziness and light-headedness.   vaccinated with Moderna second dose in  April.    Objective:   Physical Exam  AXOX3  No physical exam performed due to telemedicine appointment.      Assessment & Plan:  Fatigue if tired, take naps. S/P Covid -19 infection. Prefer patient to get RT work note from primary, but I will release patient if needed. Currently patient on  day 9.  Will talk to patient on Wednesday 10/19/20 to ensure patient is continuing to improve.She agrees with this plan.  She verbalizes understanding and has no questions at the end of our conversation.

## 2020-10-17 NOTE — Patient Instructions (Signed)

## 2020-10-19 ENCOUNTER — Encounter: Payer: Self-pay | Admitting: Medical

## 2020-10-19 ENCOUNTER — Telehealth: Payer: BC Managed Care – PPO | Admitting: Medical

## 2020-10-19 NOTE — Progress Notes (Signed)
   Subjective:    Patient ID: Cynthia Farmer, female    DOB: [birth year], 34 y.o.   MRN: [id number]  HPI  34 yo female in non acute distress.  I called [name] for a telemedicine appointment which she has consented. She says over all her fatigue is much better and she feels she can go back to work ( she works remotely).  She would like a work note showiing she can return to work. No other complaints today.   Review of Systems  Constitutional: Negative for chills and fever.  HENT: Negative for congestion, ear pain, sinus pressure, sinus pain and sore throat.   Respiratory: Negative for cough, shortness of breath and wheezing.   Cardiovascular: Negative for chest pain.  Gastrointestinal: Negative for abdominal pain, diarrhea, nausea and vomiting.  Genitourinary: Negative for difficulty urinating.  Musculoskeletal: Negative for myalgias.  Skin: Negative for color change and rash.  Neurological: Negative for dizziness, light-headedness and headaches.  Psychiatric/Behavioral: The patient is nervous/anxious (isolated feeling.).      works from home Objective:   Physical Exam  AXOX3 No physical performed due to telemedicine appointment.  No coughing noted.    Assessment & Plan:  History of Covid-19 infection, resolved.  Return to work note sent to patient in chart. Contact your PCP or [place] if you have any further concerns. Patient verbalizes understanding and has no further questions at the end of the phone call.

## 2020-10-19 NOTE — Progress Notes (Unsigned)
Patient ID: Cynthia Farmer, female   DOB: 11/30/1985, 34 y.o.   MRN: [id number]   This note is an error. I had to type something to close it.

## 2020-10-21 ENCOUNTER — Other Ambulatory Visit: Payer: Self-pay

## 2020-10-21 ENCOUNTER — Telehealth: Payer: BC Managed Care – PPO | Admitting: Nurse Practitioner

## 2020-10-21 NOTE — Progress Notes (Signed)
   Subjective:    Patient ID: Cynthia Farmer, female    DOB: January 05, 1986, 34 y.o.   MRN: [id number]  HPI  34 year old female recovering from Covid, she has some residual nasal congestion that is improving from acute/ height of symptoms.  She still has post nasal drainage as well.   She has not been taking anything OTC so far   Was able to do yoga this week but noticed the presence of sinus congestion when bending forward.     Review of Systems  Constitutional: Negative.   HENT: Positive for congestion and postnasal drip.   Gastrointestinal: Negative.   Neurological: Negative.        Objective:   Physical Exam This was a telehealth appointment with patient, no acute distress noted during call. Able to carry on conversation without SOB       Assessment & Plan:  Advised trying an OTC nasal spray like flonase or saline nasal rinse to help mobilize secretions.  Increase hydration  Advised patient clinic will be closed next week for holiday, encouraged patient to reach out to PCP or urgent care if any symptoms persist or worsen or with new concerns

## 2020-11-01 DIAGNOSIS — S6421XD Injury of radial nerve at wrist and hand level of right arm, subsequent encounter: Secondary | ICD-10-CM | POA: Diagnosis not present

## 2020-11-01 DIAGNOSIS — M79641 Pain in right hand: Secondary | ICD-10-CM | POA: Diagnosis not present

## 2020-11-11 ENCOUNTER — Telehealth: Payer: BC Managed Care – PPO | Admitting: Nurse Practitioner

## 2020-11-11 ENCOUNTER — Other Ambulatory Visit: Payer: Self-pay

## 2020-11-11 DIAGNOSIS — U071 COVID-19: Secondary | ICD-10-CM

## 2020-11-11 DIAGNOSIS — R0989 Other specified symptoms and signs involving the circulatory and respiratory systems: Secondary | ICD-10-CM

## 2020-11-11 NOTE — Progress Notes (Signed)
   Subjective:    Patient ID: Cynthia Farmer, female    DOB: [birth year], 35 y.o.   MRN: [id number]  HPI  Tested positive for COVID 10/10/20   Follow up on 10/21/20 had some residual congestion at that time.   Feels like she has some residual chest congestion, sensation that there is mucous stuck in her chest that she cannot get up.   She has also had a "rattle" in her chest.   She has been using flonase as needed.   Denies SOB or acute distress   Review of Systems  HENT: Positive for congestion.   Respiratory: Positive for cough.   Cardiovascular: Negative.   Genitourinary: Negative.   Neurological: Negative.        Objective:   Physical Exam  This was a telehealth visit with patient  No acute distress        Assessment & Plan:  Patient will try Mucinex and increased hydration over the weekend If no improvement she will go forward with chest XRAY Monday and let [facility] know she has gone for XRAY  Will follow up with results when available She will seek follow up care with any new or worsening symptoms   Offered in person appointment for patient for assessment if symptoms persist, she is not on campus at the time, is currently working remote.

## 2020-11-14 ENCOUNTER — Ambulatory Visit
Admission: RE | Admit: 2020-11-14 | Discharge: 2020-11-14 | Disposition: A | Discharge status: Home / Self Care | Payer: BC Managed Care – PPO | Source: Ambulatory Visit | Attending: Nurse Practitioner | Admitting: Nurse Practitioner

## 2020-11-14 ENCOUNTER — Other Ambulatory Visit: Payer: Self-pay | Admitting: Nurse Practitioner

## 2020-11-14 DIAGNOSIS — R0989 Other specified symptoms and signs involving the circulatory and respiratory systems: Secondary | ICD-10-CM

## 2020-11-14 DIAGNOSIS — U071 COVID-19: Secondary | ICD-10-CM

## 2020-11-14 NOTE — Progress Notes (Signed)
   Tried Mucinex Friday and Saturday started to experience some fatigue and nausea felt better yesterday  XRAY from today    Narrative & Impression  CLINICAL DATA:  35 year old female with history of COVID 1 month ago with persistent cough.  EXAM: CHEST - 2 VIEW  COMPARISON:  None.  FINDINGS: The heart size and mediastinal contours are within normal limits. Both lungs are clear. The visualized skeletal structures are unremarkable.  IMPRESSION: No acute cardiopulmonary process.   Electronically Signed   By: Marliss Coots MD   On: 11/14/2020 08:20

## 2021-01-20 DIAGNOSIS — E559 Vitamin D deficiency, unspecified: Secondary | ICD-10-CM | POA: Diagnosis not present

## 2021-03-16 DIAGNOSIS — Z20822 Contact with and (suspected) exposure to covid-19: Secondary | ICD-10-CM | POA: Diagnosis not present

## 2021-06-09 ENCOUNTER — Other Ambulatory Visit: Payer: Self-pay | Admitting: Family Medicine

## 2021-06-09 ENCOUNTER — Other Ambulatory Visit (HOSPITAL_COMMUNITY)
Admission: RE | Admit: 2021-06-09 | Discharge: 2021-06-09 | Disposition: A | Discharge status: Home / Self Care | Payer: BC Managed Care – PPO | Source: Ambulatory Visit | Attending: Family Medicine | Admitting: Family Medicine

## 2021-06-09 DIAGNOSIS — Z0000 Encounter for general adult medical examination without abnormal findings: Secondary | ICD-10-CM | POA: Diagnosis not present

## 2021-06-09 DIAGNOSIS — Z124 Encounter for screening for malignant neoplasm of cervix: Secondary | ICD-10-CM | POA: Insufficient documentation

## 2021-06-09 DIAGNOSIS — Z13220 Encounter for screening for lipoid disorders: Secondary | ICD-10-CM | POA: Diagnosis not present

## 2021-06-09 DIAGNOSIS — E559 Vitamin D deficiency, unspecified: Secondary | ICD-10-CM | POA: Diagnosis not present

## 2021-06-13 LAB — CYTOLOGY - PAP
Comment: NEGATIVE
Diagnosis: NEGATIVE
High risk HPV: NEGATIVE

## 2021-07-19 IMAGING — US US PELVIS COMPLETE WITH TRANSVAGINAL
1 series · 13 of 25 positions shown · non-contrast
Comparison: None

CLINICAL DATA: Left lower quadrant pain. Evaluate cyst versus
fibroid.

EXAM:
TRANSABDOMINAL AND TRANSVAGINAL ULTRASOUND OF PELVIS
TECHNIQUE: Both transabdominal and transvaginal ultrasound examinations of the
pelvis were performed. Transabdominal technique was performed for
global imaging of the pelvis including uterus, ovaries, adnexal
regions, and pelvic cul-de-sac. It was necessary to proceed with
endovaginal exam following the transabdominal exam to visualize the
uterus and adnexa.

[Series 1: us pelvis complete with transvaginal · 0.22mm/px · 13 of 103 slices shown]
[im 1/103]
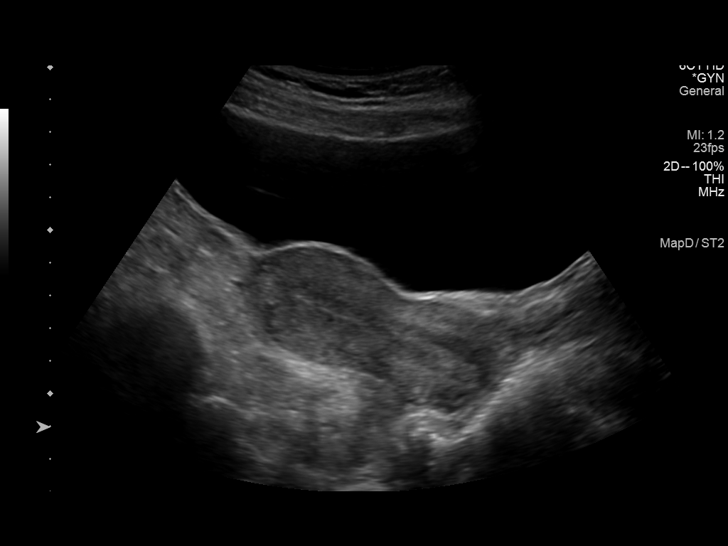
[im 9/103]
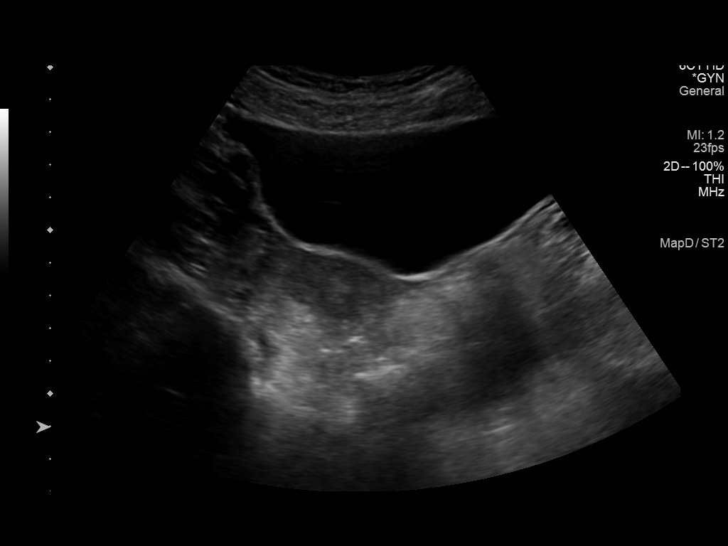
[im 18/103]
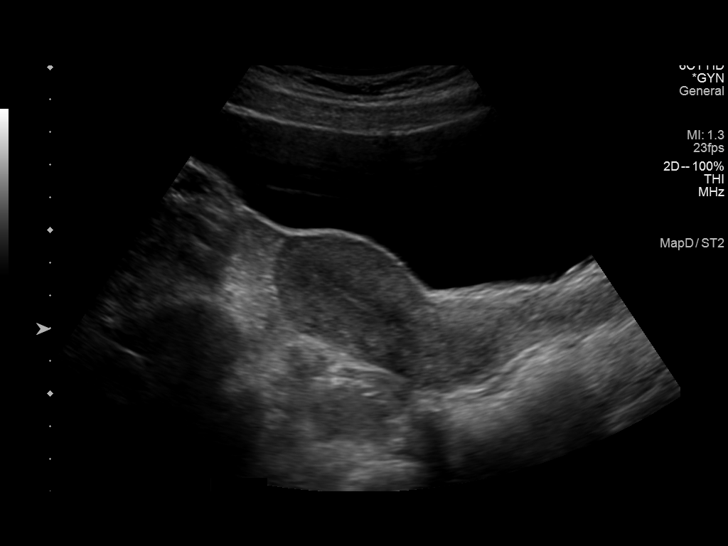
[im 26/103]
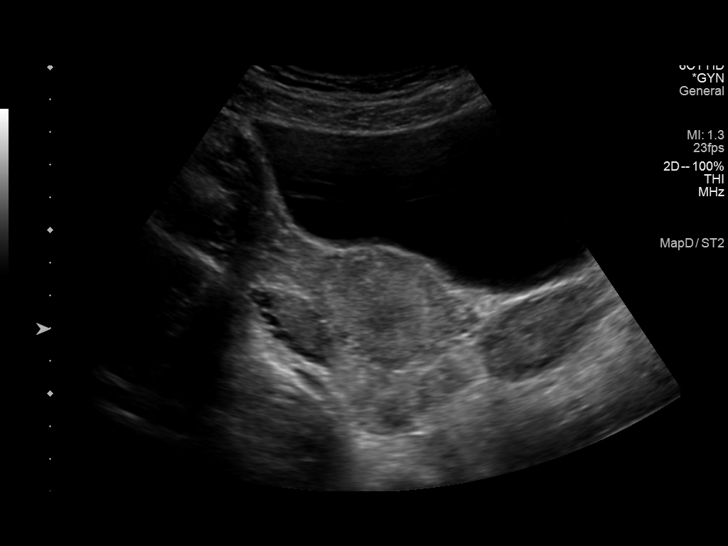
[im 35/103]
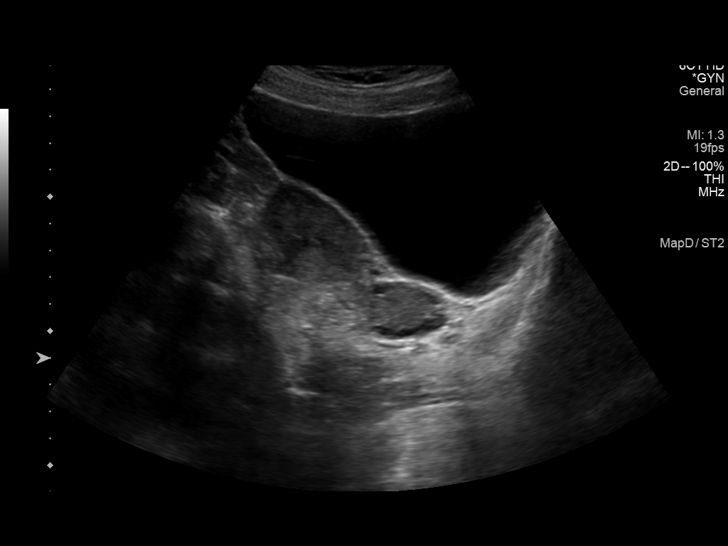
[im 43/103]
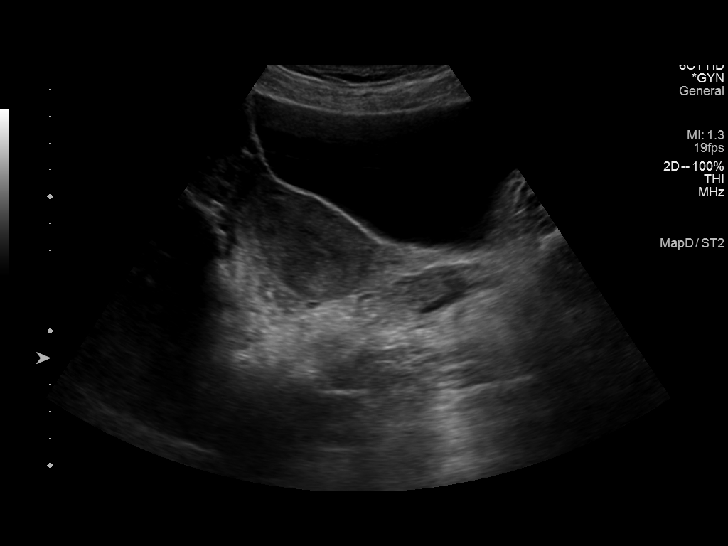
[im 52/103]
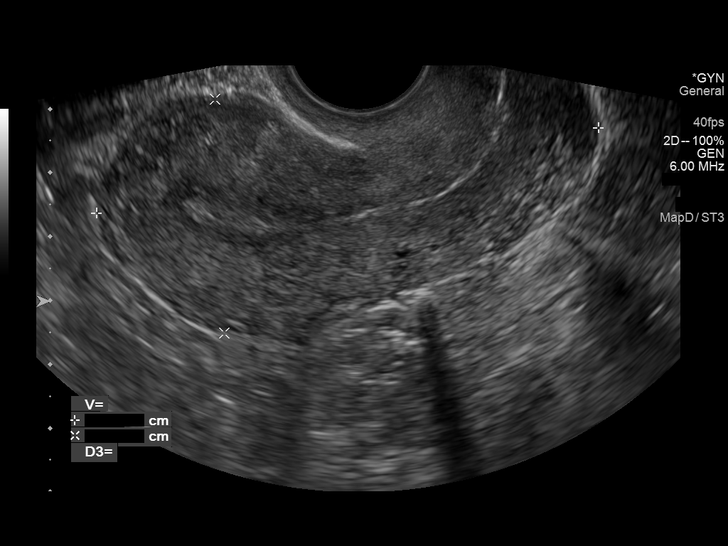
[im 60/103]
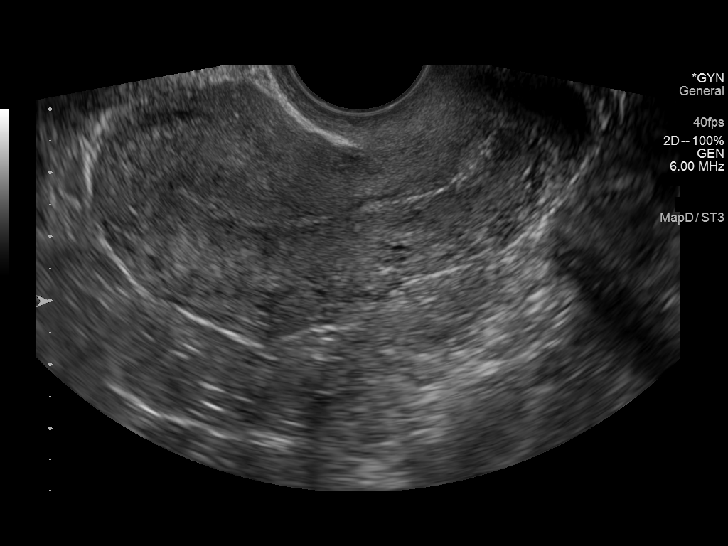
[im 69/103]
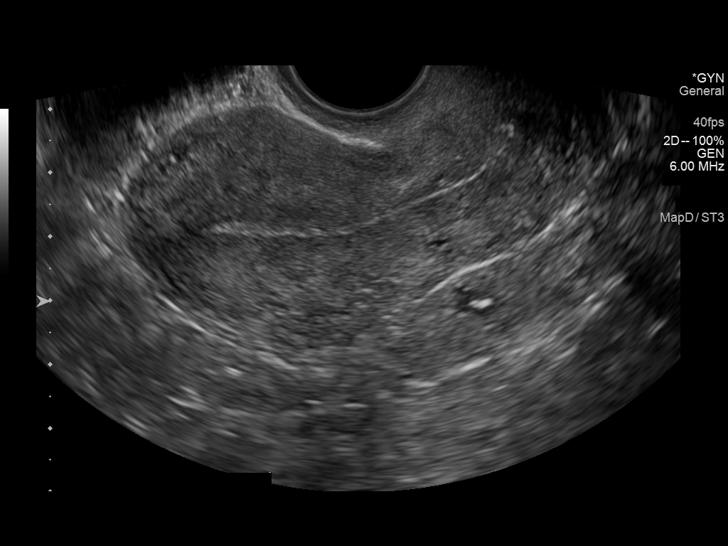
[im 77/103]
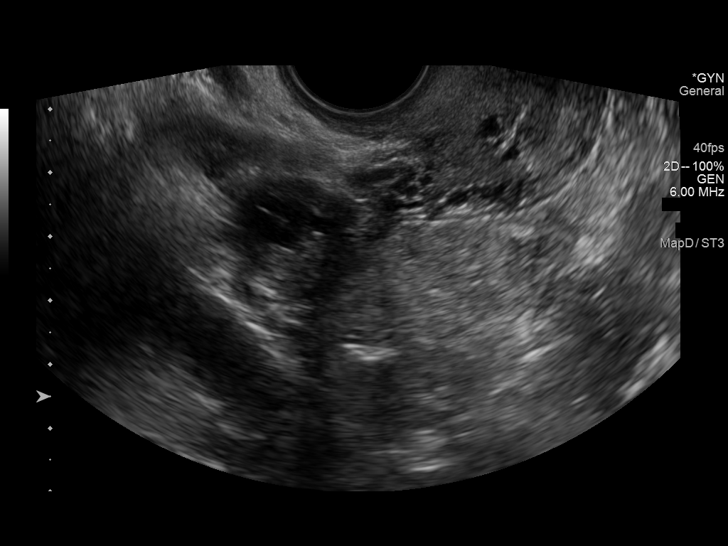
[im 86/103]
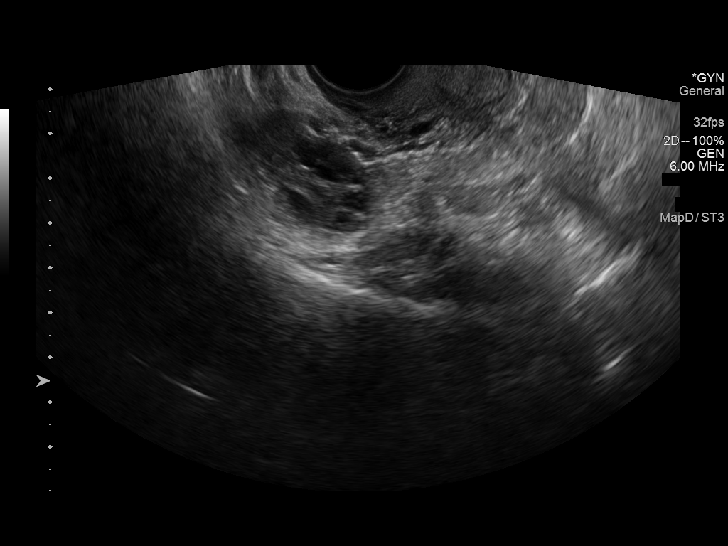
[im 94/103]
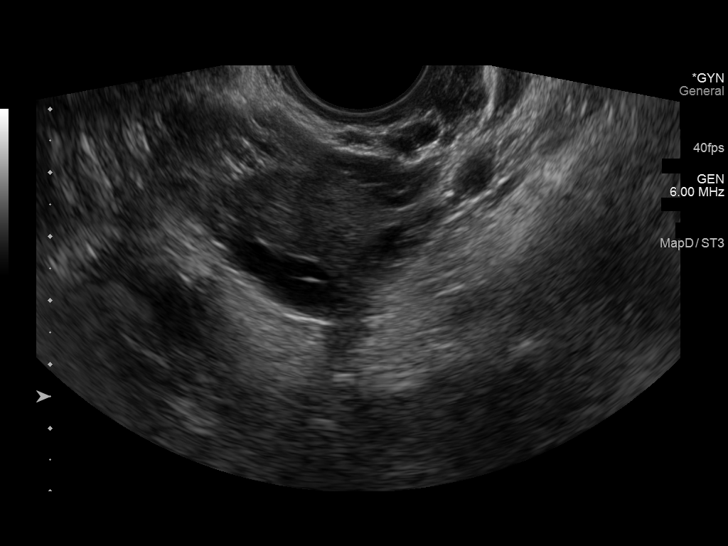
[im 103/103]
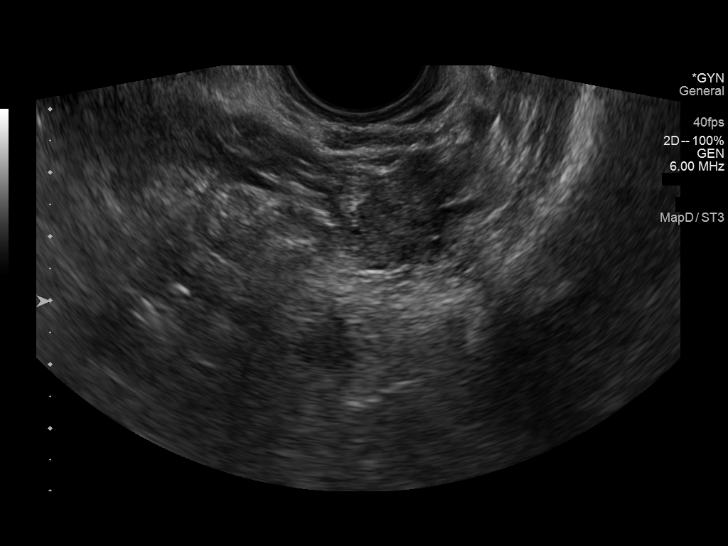

[13 of 25 positions shown; findings below may reference images not displayed]

FINDINGS: Uterus

Measurements: 8.0 x 3.7 x 4.6 cm = volume: 70 mL. Uterus is
anteverted. No fibroids or other mass visualized.

Endometrium

Thickness: 3 mm, normal.  No focal abnormality visualized.

Right ovary

Measurements: 4.0 x 2.8 x 2.2 cm = volume: 12.9 mL. No cyst or mass.
Multiple uniformly sized follicles are oriented peripherally with
slightly echogenic stroma. Blood flow is demonstrated.

Left ovary

Measurements: 3.0 x 2.3 x 2.6 cm = volume: 9.5 mL. No cyst or mass.
Multiple follicles that are oriented peripherally with slightly
echogenic stroma. Blood flow is demonstrated.

Other findings

Small amount of free fluid is likely physiologic.
IMPRESSION: 1. Normal sonographic appearance of the uterus, no fibroids.
2. No ovarian cyst or adnexal mass.
3. Multiple peripherally distributed follicles within both ovaries
which can be seen in the setting of polycystic ovarian syndrome.
Recommend clinical correlation.

## 2021-08-09 ENCOUNTER — Ambulatory Visit: Payer: BC Managed Care – PPO

## 2021-08-10 DIAGNOSIS — Z23 Encounter for immunization: Secondary | ICD-10-CM | POA: Diagnosis not present

## 2021-08-23 ENCOUNTER — Ambulatory Visit: Payer: BC Managed Care – PPO

## 2021-08-24 ENCOUNTER — Other Ambulatory Visit: Payer: Self-pay

## 2021-08-24 ENCOUNTER — Ambulatory Visit: Payer: BC Managed Care – PPO

## 2021-08-24 DIAGNOSIS — Z23 Encounter for immunization: Secondary | ICD-10-CM

## 2021-09-13 DIAGNOSIS — D2261 Melanocytic nevi of right upper limb, including shoulder: Secondary | ICD-10-CM | POA: Diagnosis not present

## 2021-09-13 DIAGNOSIS — D225 Melanocytic nevi of trunk: Secondary | ICD-10-CM | POA: Diagnosis not present

## 2021-09-13 DIAGNOSIS — D2262 Melanocytic nevi of left upper limb, including shoulder: Secondary | ICD-10-CM | POA: Diagnosis not present

## 2021-09-13 DIAGNOSIS — D2272 Melanocytic nevi of left lower limb, including hip: Secondary | ICD-10-CM | POA: Diagnosis not present

## 2021-09-18 DIAGNOSIS — T781XXD Other adverse food reactions, not elsewhere classified, subsequent encounter: Secondary | ICD-10-CM | POA: Diagnosis not present

## 2021-09-18 DIAGNOSIS — J3089 Other allergic rhinitis: Secondary | ICD-10-CM | POA: Diagnosis not present

## 2021-09-18 DIAGNOSIS — R059 Cough, unspecified: Secondary | ICD-10-CM | POA: Diagnosis not present

## 2021-09-18 DIAGNOSIS — H1045 Other chronic allergic conjunctivitis: Secondary | ICD-10-CM | POA: Diagnosis not present

## 2021-10-17 IMAGING — DX DG CHEST 2V
2 series · 2 of 2 positions shown · non-contrast
Comparison: None.

CLINICAL DATA: 34-year-old female with history of COVID 1 month ago
with persistent cough.

EXAM:
CHEST - 2 VIEW

[dg chest 2 view (1 of 2)]
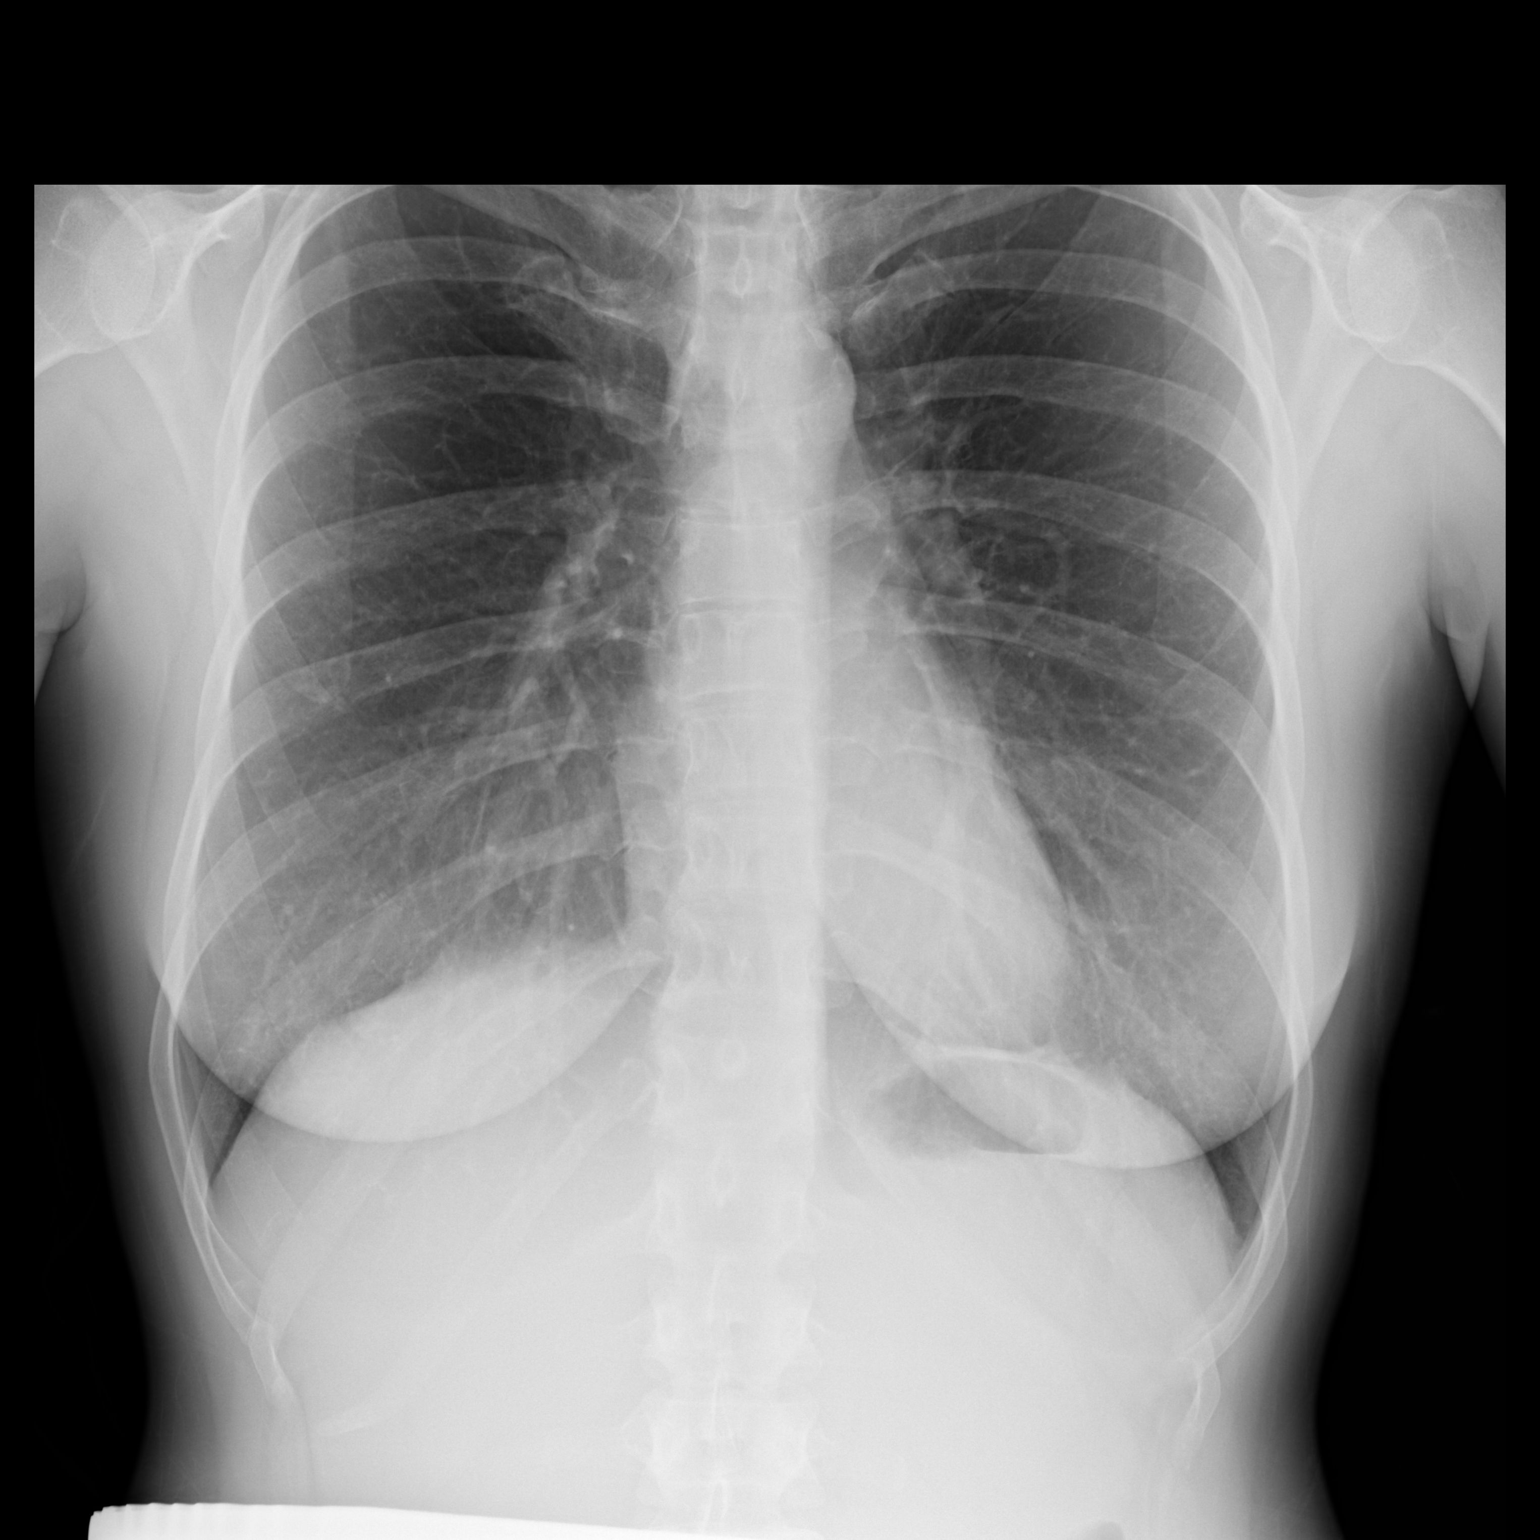

[dg chest 2 view (2 of 2)]
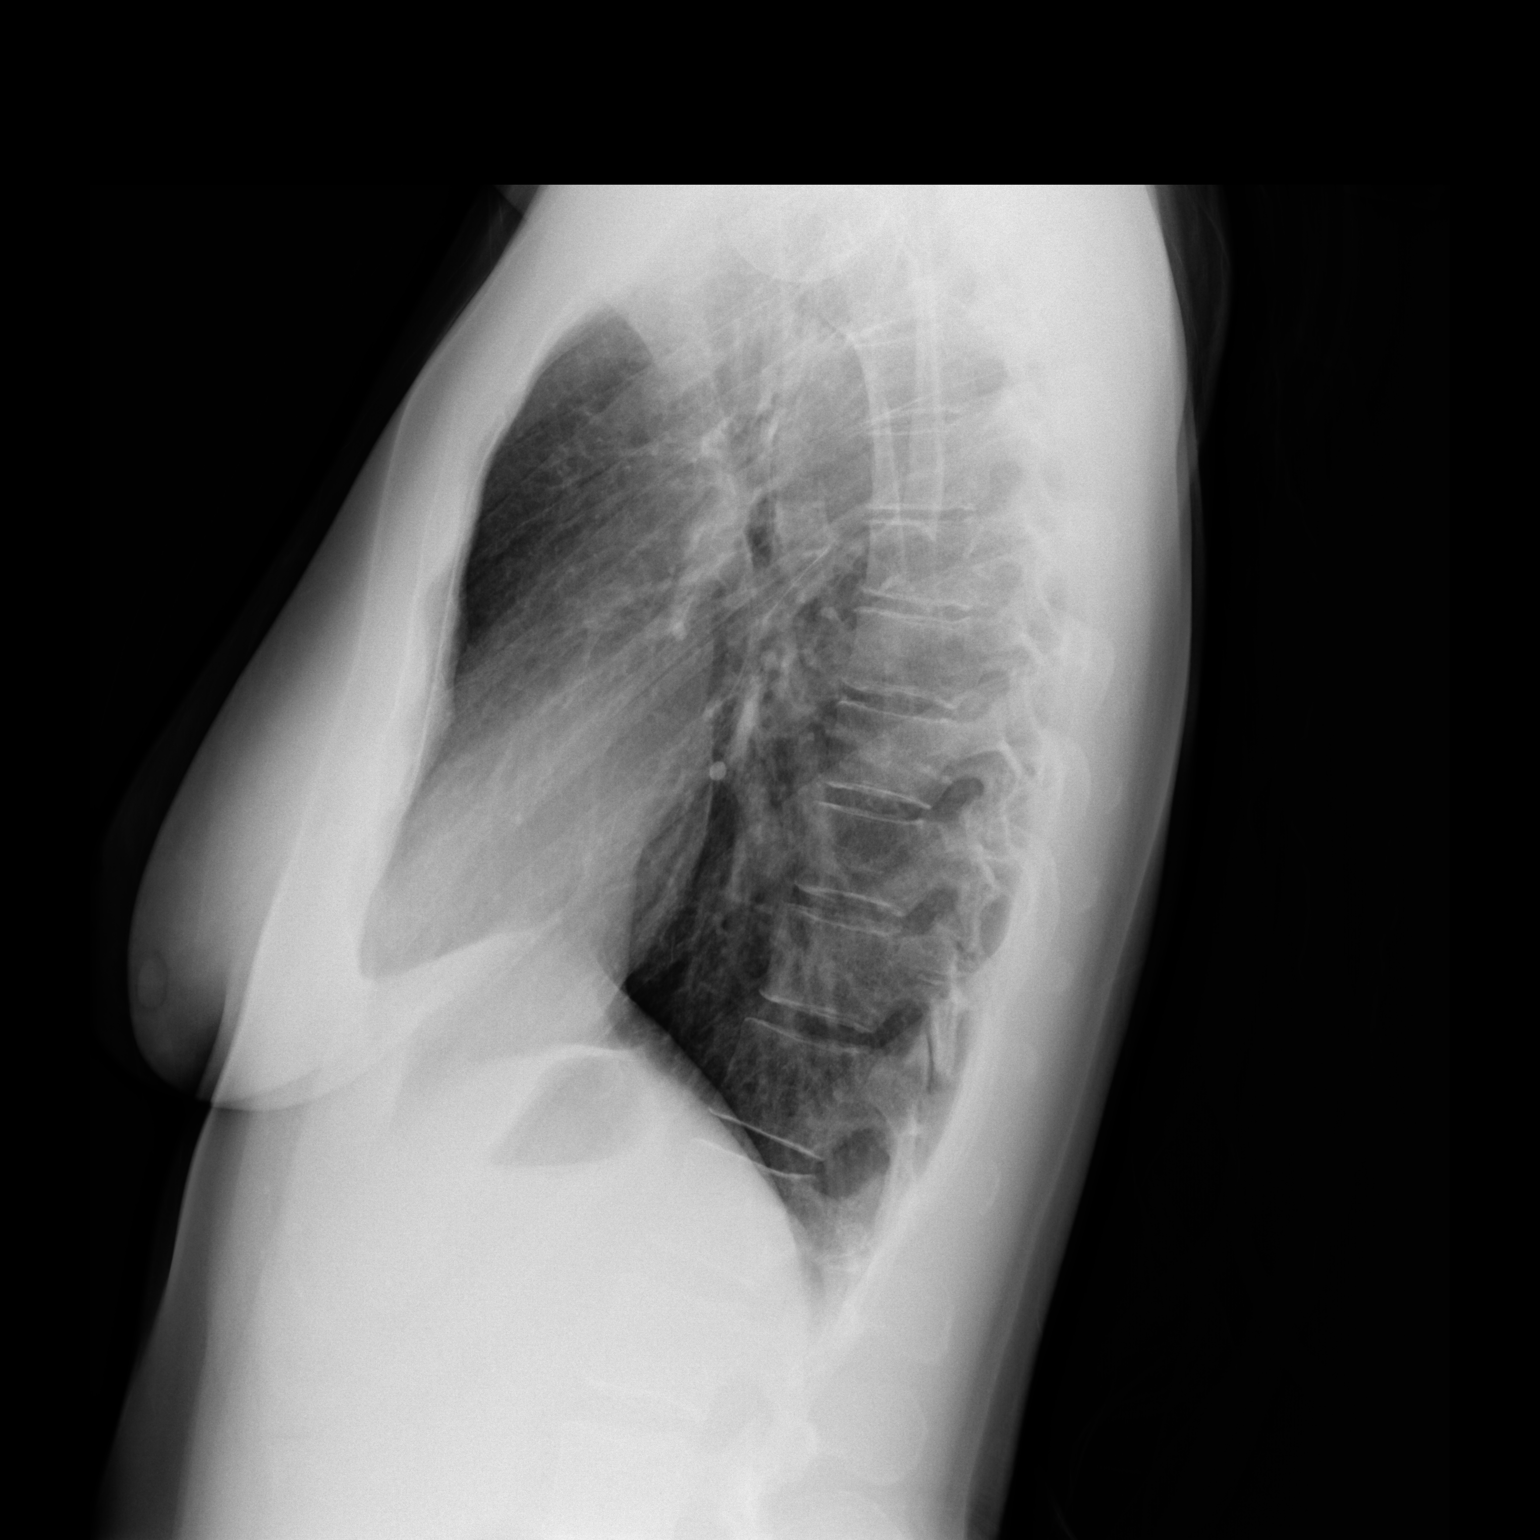

[2 of 2 positions shown; findings below may reference images not displayed]

FINDINGS: The heart size and mediastinal contours are within normal limits.
Both lungs are clear. The visualized skeletal structures are
unremarkable.
IMPRESSION: No acute cardiopulmonary process.

## 2021-10-24 ENCOUNTER — Encounter: Payer: Self-pay | Admitting: Medical

## 2021-10-24 ENCOUNTER — Other Ambulatory Visit: Payer: Self-pay

## 2021-10-24 ENCOUNTER — Ambulatory Visit: Payer: BC Managed Care – PPO | Admitting: Medical

## 2021-10-24 VITALS — BP 121/69 | HR 88 | Temp 98.1°F | Resp 16 | Ht 67.0 in | Wt 153.8 lb

## 2021-10-24 DIAGNOSIS — J029 Acute pharyngitis, unspecified: Secondary | ICD-10-CM

## 2021-10-24 LAB — POC COVID19 BINAXNOW: SARS Coronavirus 2 Ag: NEGATIVE

## 2021-10-24 LAB — POCT RAPID STREP A (OFFICE): Rapid Strep A Screen: NEGATIVE

## 2021-10-24 MED ORDER — CLINDAMYCIN HCL 300 MG PO CAPS: 300.0000 mg | ORAL_CAPSULE | Freq: Three times a day (TID) | ORAL | 0 refills | Status: DC

## 2021-10-24 NOTE — Progress Notes (Signed)
Subjective:    Patient ID: Cynthia Farmer, female    DOB: 10/13/1986, 35 y.o.   MRN: [id number]  HPI 35 yo female in non acute distress comes in to clinic with a ST x  days. History of ST with HA and ear pressure since last Thursday  10/19/21. Denies fever or chills..   Thought she was allergic to cat went to ENT dx wih allergies mold and dust. Got dust covers and HEPA filters.  She stopped allergy medications. Restarted  Flonase after sore throat started.  No pain now.  Blood pressure 121/69, pulse 88, temperature 98.1 F (36.7 C), temperature source Oral, resp. rate 16, height 5\' 7"  (1.702 m), weight 153 lb 12.8 oz (69.8 kg), SpO2 99 %, unknown if currently breastfeeding.  Allergies  Allergen Reactions   Azithromycin Itching   Ceclor [Cefaclor] Swelling   Amoxicillin Rash     Review of Systems  Constitutional:  Negative for chills and fever.  HENT:  Positive for postnasal drip, rhinorrhea (clear), sinus pressure and sore throat. Negative for ear pain (pressure) and sinus pain.   Respiratory:  Positive for cough (dry). Negative for shortness of breath.   Cardiovascular:  Negative for chest pain.  Gastrointestinal:  Negative for abdominal pain, diarrhea, nausea and vomiting.  Musculoskeletal:  Negative for myalgias.  Skin:  Negative for color change.  Neurological:  Positive for headaches. Negative for dizziness, syncope and light-headedness.       Objective:   Physical Exam Constitutional:      Appearance: She is well-developed.  HENT:     Head: Normocephalic and atraumatic.     Right Ear: Ear canal normal. A middle ear effusion is present. Tympanic membrane is not erythematous.     Left Ear: Ear canal normal. A middle ear effusion is present. Tympanic membrane is not erythematous.     Mouth/Throat:     Mouth: Mucous membranes are moist. No oral lesions.     Pharynx: Uvula midline. Pharyngeal swelling (mild) and posterior oropharyngeal erythema present. No  oropharyngeal exudate.     Tonsils: 1+ on the right. 1+ on the left.  Eyes:     Conjunctiva/sclera: Conjunctivae normal.     Pupils: Pupils are equal, round, and reactive to light.  Cardiovascular:     Rate and Rhythm: Normal rate and regular rhythm.     Heart sounds: Normal heart sounds.  Pulmonary:     Effort: Pulmonary effort is normal.     Breath sounds: Normal breath sounds.  Musculoskeletal:     Cervical back: Normal range of motion and neck supple.  Lymphadenopathy:     Cervical: No cervical adenopathy.  Skin:    General: Skin is warm and dry.  Neurological:     General: No focal deficit present.     Mental Status: She is alert and oriented to person, place, and time.  Psychiatric:        Mood and Affect: Mood normal.        Behavior: Behavior normal.         Recent Results (from the past 2160 hour(s))  POC COVID-19     Status: Normal   Collection Time: 10/24/21 10:14 AM  Result Value Ref Range   SARS Coronavirus 2 Ag Negative Negative  POCT rapid strep A     Status: Normal   Collection Time: 10/24/21 10:29 AM  Result Value Ref Range   Rapid Strep A Screen Negative Negative    Assessment & Plan:  Pharyngitis Dilute  salt water gargles 4 x / day, OTC Motrin or Tylenol for fever or pain per package instructions. Follow up here or your doctor if not improving in 3-5 days. If diarrhea  5x/day to call cliic. Meds ordered this encounter  Medications   clindamycin (CLEOCIN) 300 MG capsule    Sig: Take 1 capsule (300 mg total) by mouth 3 (three) times daily.    Dispense:  21 capsule    Refill:  0   clindamycin (CLEOCIN) 300 MG capsule    Sig: Take 1 capsule (300 mg total) by mouth 3 (three) times daily.    Dispense:  21 capsule    Refill:  0   Initially sent to wrong pharmacy. Called and cancelled prescription, sent to [facility]. Patient verbalizes understanding and has no questions at discharge.

## 2021-10-24 NOTE — Patient Instructions (Addendum)
Dilute salt water gargles, soft foods, avoid spicy foods or tomatoes or orange juice  Pharyngitis Pharyngitis is a sore throat (pharynx). This is when there is redness, pain, and swelling in your throat. Most of the time, this condition gets better on its own. In some cases, you may need medicine. What are the causes? An infection from a virus. An infection from bacteria. Allergies. What increases the risk? Being 34-36 years old. Being in crowded environments. These include: Daycares. Schools. Dormitories. Living in a place with cold temperatures outside. Having a weakened disease-fighting (immune) system. What are the signs or symptoms? Symptoms may vary depending on the cause. Common symptoms include: Sore throat. Tiredness (fatigue). Low-grade fever. Stuffy nose. Cough. Headache. Other symptoms may include: Glands in the neck (lymph nodes) that are swollen. Skin rashes. Film on the throat or tonsils. This can be caused by an infection from bacteria. Vomiting. Red, itchy eyes. Loss of appetite. Joint pain and muscle aches. Tonsils that are temporarily bigger than usual (enlarged). How is this treated? Many times, treatment is not needed. This condition usually gets better in 3-4 days without treatment. If the infection is caused by a bacteria, you may be need to take antibiotics. Follow these instructions at home: Medicines Take over-the-counter and prescription medicines only as told by your doctor. If you were prescribed an antibiotic medicine, take it as told by your doctor. Do not stop taking the antibiotic even if you start to feel better. Use throat lozenges or sprays to soothe your throat as told by your doctor. Children can get pharyngitis. Do not give your child aspirin. Managing pain To help with pain, try: Sipping warm liquids, such as: Broth. Herbal tea. Warm water. Eating or drinking cold or frozen liquids, such as frozen ice pops. Rinsing your mouth  (gargle) with a salt water mixture 3-4 times a day or as needed. To make salt water, dissolve -1 tsp (3-6 g) of salt in 1 cup (237 mL) of warm water. Do not swallow this mixture. Sucking on hard candy or throat lozenges. Putting a cool-mist humidifier in your bedroom at night to moisten the air. Sitting in the bathroom with the door closed for 5-10 minutes while you run hot water in the shower.  General instructions  Do not smoke or use any products that contain nicotine or tobacco. If you need help quitting, ask your doctor. Rest as told by your doctor. Drink enough fluid to keep your pee (urine) pale yellow. How is this prevented? Wash your hands often for at least 20 seconds with soap and water. If soap and water are not available, use hand sanitizer. Do not touch your eyes, nose, or mouth with unwashed hands. Wash hands after touching these areas. Do not share cups or eating utensils. Avoid close contact with people who are sick. Contact a doctor if: You have large, tender lumps in your neck. You have a rash. You cough up green, yellow-brown, or bloody spit. Get help right away if: You have a stiff neck. You drool or cannot swallow liquids. You cannot drink or take medicines without vomiting. You have very bad pain that does not go away with medicine. You have problems breathing, and it is not from a stuffy nose. You have new pain and swelling in your knees, ankles, wrists, or elbows. These symptoms may be an emergency. Get help right away. Call your local emergency services (911 in the [place] Do not wait to see if the symptoms will go away.  Do not drive yourself to the hospital. Summary Pharyngitis is a sore throat (pharynx). This is when there is redness, pain, and swelling in your throat. Most of the time, pharyngitis gets better on its own. Sometimes, you may need medicine. If you were prescribed an antibiotic medicine, take it as told by your doctor. Do not stop taking the  antibiotic even if you start to feel better. This information is not intended to replace advice given to you by your health care provider. Make sure you discuss any questions you have with your health care provider. Document Revised: 01/18/2021 Document Reviewed: 01/18/2021 Elsevier Patient Education  2022 [employer] Eustachian Tube Dysfunction Eustachian tube dysfunction refers to a condition in which a blockage develops in the narrow passage that connects the middle ear to the back of the nose (eustachian tube). The eustachian tube regulates air pressure in the middle ear by letting air move between the ear and nose. It also helps to drain fluid from the middle ear space. Eustachian tube dysfunction can affect one or both ears. When the eustachian tube does not function properly, air pressure, fluid, or both can build up in the middle ear. What are the causes? This condition occurs when the eustachian tube becomes blocked or cannot open normally. Common causes of this condition include: Ear infections. Colds and other infections that affect the nose, mouth, and throat (upper respiratory tract). Allergies. Irritation from cigarette smoke. Irritation from stomach acid coming up into the esophagus (gastroesophageal reflux). The esophagus is the part of the body that moves food from the mouth to the stomach. Sudden changes in air pressure, such as from descending in an airplane or scuba diving. Abnormal growths in the nose or throat, such as: Growths that line the nose (nasal polyps). Abnormal growth of cells (tumors). Enlarged tissue at the back of the throat (adenoids). What increases the risk? You are more likely to develop this condition if: You smoke. You are overweight. You are a child who has: Certain birth defects of the mouth, such as cleft palate. Large tonsils or adenoids. What are the signs or symptoms? Common symptoms of this condition include: A feeling of fullness in the  ear. Ear pain. Clicking or popping noises in the ear. Ringing in the ear (tinnitus). Hearing loss. Loss of balance. Dizziness. Symptoms may get worse when the air pressure around you changes, such as when you travel to an area of high elevation, fly on an airplane, or go scuba diving. How is this diagnosed? This condition may be diagnosed based on: Your symptoms. A physical exam of your ears, nose, and throat. Tests, such as those that measure: The movement of your eardrum. Your hearing (audiometry). How is this treated? Treatment depends on the cause and severity of your condition. In mild cases, you may relieve your symptoms by moving air into your ears. This is called "popping the ears." In more severe cases, or if you have symptoms of fluid in your ears, treatment may include: Medicines to relieve congestion (decongestants). Medicines that treat allergies (antihistamines). Nasal sprays or ear drops that contain medicines that reduce swelling (steroids). A procedure to drain the fluid in your eardrum. In this procedure, a small tube may be placed in the eardrum to: Drain the fluid. Restore the air in the middle ear space. A procedure to insert a balloon device through the nose to inflate the opening of the eustachian tube (balloon dilation). Follow these instructions at home: Lifestyle Do not do any of  the following until your health care provider approves: Travel to high altitudes. Fly in airplanes. Work in a Estate agent or room. Scuba dive. Do not use any products that contain nicotine or tobacco. These products include cigarettes, chewing tobacco, and vaping devices, such as e-cigarettes. If you need help quitting, ask your health care provider. Keep your ears dry. Wear fitted earplugs during showering and bathing. Dry your ears completely after. General instructions Take over-the-counter and prescription medicines only as told by your health care provider. Use  techniques to help pop your ears as recommended by your health care provider. These may include: Chewing gum. Yawning. Frequent, forceful swallowing. Closing your mouth, holding your nose closed, and gently blowing as if you are trying to blow air out of your nose. Keep all follow-up visits. This is important. Contact a health care provider if: Your symptoms do not go away after treatment. Your symptoms come back after treatment. You are unable to pop your ears. You have: A fever. Pain in your ear. Pain in your head or neck. Fluid draining from your ear. Your hearing suddenly changes. You become very dizzy. You lose your balance. Get help right away if: You have a sudden, severe increase in any of your symptoms. Summary Eustachian tube dysfunction refers to a condition in which a blockage develops in the eustachian tube. It can be caused by ear infections, allergies, inhaled irritants, or abnormal growths in the nose or throat. Symptoms may include ear pain or fullness, hearing loss, or ringing in the ears. Mild cases are treated with techniques to unblock the ears, such as yawning or chewing gum. More severe cases are treated with medicines or procedures. This information is not intended to replace advice given to you by your health care provider. Make sure you discuss any questions you have with your health care provider. Document Revised: 01/02/2021 Document Reviewed: 01/02/2021 Elsevier Patient Education  2022 [employer].

## 2021-11-22 DIAGNOSIS — H1045 Other chronic allergic conjunctivitis: Secondary | ICD-10-CM | POA: Diagnosis not present

## 2022-01-24 DIAGNOSIS — F419 Anxiety disorder, unspecified: Secondary | ICD-10-CM | POA: Diagnosis not present

## 2022-02-21 ENCOUNTER — Ambulatory Visit: Payer: BC Managed Care – PPO

## 2022-02-21 NOTE — Progress Notes (Signed)
Nutrition: 02/21/2022 ? ?CC: " I have developed some food intolerances to sugar containing foods." ? ?Assessment: ?Ht: 5'7'   WT: 128 lb  BMI: 24.08 ? ?Notes that over the past few months, she is having a "building up of gastrointestinal issues re  Describes increased burping and abdominal gas after eating milk/dairy." Speculated that this might be a lactose intolerance.  Found that using alternative milks did not totally solve the issue.  Discovered fewer issues with low sugar oat milk. At this time, almond milk, apple juice, cookies, are problems.  She is avoiding all milks and cane sugar. Found increased gas formation with chick pea pasta, and chick pea/lentil pasta. ? ?Currently tolerating: Grits, wheat bagels, eggs, aged cheeses, bananas, nuts (pecans), many vegetables, and sourdough toast. ? ?Notes that her weight has stayed consistent.  Bowel routine is generally is unchanged.  She does experience increased gas with some foods, and some foods appear to cause looser stools. Denies diarrhea.  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 ? ?Recommendations: ?RD is to provide a list of the FODMAP foods to her by e-mail. ?RD will provide suggestions for specific foods to avoid. ?RD will provide some snack suggestions. ?RD will provide guidance for the food diet elimination process. ? ?Will plan to follow-up as needed in the clinic. ? ?Maggie Teandre Hamre, RN, RD, LDN ? ?

## 2022-06-08 DIAGNOSIS — Z13220 Encounter for screening for lipoid disorders: Secondary | ICD-10-CM | POA: Diagnosis not present

## 2022-06-08 DIAGNOSIS — Z0000 Encounter for general adult medical examination without abnormal findings: Secondary | ICD-10-CM | POA: Diagnosis not present

## 2022-09-13 DIAGNOSIS — D2262 Melanocytic nevi of left upper limb, including shoulder: Secondary | ICD-10-CM | POA: Diagnosis not present

## 2022-09-13 DIAGNOSIS — D2272 Melanocytic nevi of left lower limb, including hip: Secondary | ICD-10-CM | POA: Diagnosis not present

## 2022-09-13 DIAGNOSIS — D225 Melanocytic nevi of trunk: Secondary | ICD-10-CM | POA: Diagnosis not present

## 2022-09-13 DIAGNOSIS — D2271 Melanocytic nevi of right lower limb, including hip: Secondary | ICD-10-CM | POA: Diagnosis not present

## 2022-10-29 DIAGNOSIS — R1084 Generalized abdominal pain: Secondary | ICD-10-CM | POA: Diagnosis not present

## 2022-11-14 DIAGNOSIS — K9049 Malabsorption due to intolerance, not elsewhere classified: Secondary | ICD-10-CM | POA: Diagnosis not present

## 2022-11-14 DIAGNOSIS — R109 Unspecified abdominal pain: Secondary | ICD-10-CM | POA: Diagnosis not present

## 2022-11-14 DIAGNOSIS — R140 Abdominal distension (gaseous): Secondary | ICD-10-CM | POA: Diagnosis not present

## 2022-11-20 DIAGNOSIS — H1045 Other chronic allergic conjunctivitis: Secondary | ICD-10-CM | POA: Diagnosis not present

## 2022-11-20 DIAGNOSIS — J3089 Other allergic rhinitis: Secondary | ICD-10-CM | POA: Diagnosis not present

## 2022-11-20 DIAGNOSIS — R1084 Generalized abdominal pain: Secondary | ICD-10-CM | POA: Diagnosis not present

## 2022-11-20 DIAGNOSIS — R052 Subacute cough: Secondary | ICD-10-CM | POA: Diagnosis not present

## 2022-12-03 DIAGNOSIS — E559 Vitamin D deficiency, unspecified: Secondary | ICD-10-CM | POA: Diagnosis not present

## 2022-12-03 DIAGNOSIS — R109 Unspecified abdominal pain: Secondary | ICD-10-CM | POA: Diagnosis not present

## 2022-12-31 ENCOUNTER — Encounter: Payer: Self-pay | Admitting: Physician Assistant

## 2022-12-31 ENCOUNTER — Ambulatory Visit (INDEPENDENT_AMBULATORY_CARE_PROVIDER_SITE_OTHER): Payer: Self-pay | Admitting: Physician Assistant

## 2022-12-31 VITALS — BP 114/60 | HR 75 | Temp 98.1°F | Wt 141.6 lb

## 2022-12-31 DIAGNOSIS — H6122 Impacted cerumen, left ear: Secondary | ICD-10-CM

## 2022-12-31 NOTE — Progress Notes (Signed)
[facility] [address], [place] [address]   Office Visit Note  Patient Name: Cynthia Farmer Date of Birth [id number]  Medical Record number [id number]  Date of Service: 12/31/2022  Chief Complaint  Patient presents with   Ear Pain    Started Fri. Left Ear Pain. Starting feeling better over the weekend. Dr. in Jan and he said she had a lot of wax. Uses ear plugs at night. No sinus issues, fever or BA      37 y/o F presents to the clinic for c/o left ear discomfort/congestion x few days. Has used otc ear wax removal kit, however not sure if that helped or not. Denies cold symptoms. She does admit that she uses ear plugs at night to help maintain sleep. Denies nausea, vomiting, dizziness, ear drainage, ringing in the ear. No recent air travel or dental surgery.       Current Medication:  Outpatient Encounter Medications as of 12/31/2022  Medication Sig   Ascorbic Acid (VITAMIN C) 100 MG CHEW Vitamin C   Cholecalciferol (VITAMIN D) 10 MCG/ML LIQD Vitamin D   clindamycin (CLEOCIN) 300 MG capsule Take 1 capsule (300 mg total) by mouth 3 (three) times daily. (Patient not taking: Reported on 12/31/2022)   clindamycin (CLEOCIN) 300 MG capsule Take 1 capsule (300 mg total) by mouth 3 (three) times daily. (Patient not taking: Reported on 12/31/2022)   docusate sodium (COLACE) 100 MG capsule Take 1 capsule (100 mg total) by mouth 2 (two) times daily. (Patient not taking: Reported on 10/24/2021)   DRYSOL 20 % external solution  (Patient not taking: Reported on 10/24/2021)   fluticasone (FLONASE) 50 MCG/ACT nasal spray Place into both nostrils daily. (Patient not taking: Reported on 12/31/2022)   ibuprofen (ADVIL,MOTRIN) 600 MG tablet Take 1 tablet (600 mg total) by mouth every 6 (six) hours. (Patient not taking: Reported on 10/24/2021)   ondansetron (ZOFRAN) 4 MG tablet Take 1 tablet (4 mg total) by mouth every 8 (eight) hours as needed for nausea or vomiting. (Patient not taking:  Reported on 10/24/2021)   potassium chloride SA (KLOR-CON) 20 MEQ tablet Take 1 tablet (20 mEq total) by mouth 2 (two) times daily. (Patient not taking: Reported on 10/24/2021)   XULANE 150-35 MCG/24HR transdermal patch Place 1 patch onto the skin once a week.   No facility-administered encounter medications on file as of 12/31/2022.      Medical History: Past Medical History:  Diagnosis Date   Hx of varicella    Medical history non-contributory      Vital Signs: BP 114/60 (BP Location: Left Arm, Patient Position: Sitting, Cuff Size: Normal)   Pulse 75   Temp 98.1 F (36.7 C) (Tympanic)   Wt 141 lb 9.6 oz (64.2 kg)   SpO2 99%   BMI 22.18 kg/m    Review of Systems  Constitutional: Negative.   HENT:  Positive for ear pain. Negative for ear discharge, postnasal drip, sinus pressure, sinus pain, sore throat and trouble swallowing.   Respiratory: Negative.    Cardiovascular: Negative.   Neurological: Negative.     Physical Exam Constitutional:      Appearance: Normal appearance.  HENT:     Head: Atraumatic.     Right Ear: Tympanic membrane, ear canal and external ear normal.     Left Ear: Ear canal and external ear normal.     Ears:     Comments: L TM not visualized. Occluded with cerumen.  Irrigated L ear. Post  irrigation L TM appears normal.  Patient tolerated procedure well.      Nose: Nose normal.     Mouth/Throat:     Mouth: Mucous membranes are moist.     Pharynx: Oropharynx is clear.  Eyes:     Extraocular Movements: Extraocular movements intact.  Cardiovascular:     Rate and Rhythm: Normal rate and regular rhythm.  Pulmonary:     Effort: Pulmonary effort is normal.     Breath sounds: Normal breath sounds.  Musculoskeletal:     Cervical back: Neck supple.  Skin:    General: Skin is warm.  Neurological:     Mental Status: She is alert.  Psychiatric:        Mood and Affect: Mood normal.        Behavior: Behavior normal.        Thought Content:  Thought content normal.        Judgment: Judgment normal.       Assessment/Plan:  1. Left ear impacted cerumen  RTC prn Pt verbalized understanding and in agreement.    General Counseling: naveh crumrine understanding of the findings of todays visit and agrees with plan of treatment. I have discussed any further diagnostic evaluation that may be needed or ordered today. We also reviewed her medications today. she has been encouraged to call the office with any questions or concerns that should arise related to todays visit.    Time spent:20 [place], [place] Physician Assistant

## 2023-01-02 ENCOUNTER — Encounter: Payer: Self-pay | Admitting: Physician Assistant

## 2023-01-02 ENCOUNTER — Ambulatory Visit (INDEPENDENT_AMBULATORY_CARE_PROVIDER_SITE_OTHER): Payer: Self-pay | Admitting: Physician Assistant

## 2023-01-02 VITALS — Temp 98.0°F

## 2023-01-02 DIAGNOSIS — S8002XA Contusion of left knee, initial encounter: Secondary | ICD-10-CM

## 2023-01-02 NOTE — Progress Notes (Signed)
[facility] [address], [place] [address]   Office Visit Note  Patient Name: Cynthia Farmer Date of Birth [id number]  Medical Record number [id number]  Date of Service: 01/02/2023  Chief Complaint  Patient presents with   Knee Injury    [place] 11 days ago and injured her left knee. Still hurts     37 y/o F presents to the clinic for c/o a bruise on the left knee x 11 days. Pt fell while ice-skating. Pain only with palpation. Since then has continued with her daily activities without any limitations. Hasn't applied ice or heat. Hasn't had the need to take any otc anti-inflammatory medicine.       Current Medication:  Outpatient Encounter Medications as of 01/02/2023  Medication Sig   Ascorbic Acid (VITAMIN C) 100 MG CHEW Vitamin C   Cholecalciferol (VITAMIN D) 10 MCG/ML LIQD Vitamin D   XULANE 150-35 MCG/24HR transdermal patch Place 1 patch onto the skin once a week.   clindamycin (CLEOCIN) 300 MG capsule Take 1 capsule (300 mg total) by mouth 3 (three) times daily. (Patient not taking: Reported on 12/31/2022)   clindamycin (CLEOCIN) 300 MG capsule Take 1 capsule (300 mg total) by mouth 3 (three) times daily. (Patient not taking: Reported on 12/31/2022)   docusate sodium (COLACE) 100 MG capsule Take 1 capsule (100 mg total) by mouth 2 (two) times daily. (Patient not taking: Reported on 10/24/2021)   DRYSOL 20 % external solution  (Patient not taking: Reported on 10/24/2021)   fluticasone (FLONASE) 50 MCG/ACT nasal spray Place into both nostrils daily. (Patient not taking: Reported on 12/31/2022)   ibuprofen (ADVIL,MOTRIN) 600 MG tablet Take 1 tablet (600 mg total) by mouth every 6 (six) hours. (Patient not taking: Reported on 10/24/2021)   ondansetron (ZOFRAN) 4 MG tablet Take 1 tablet (4 mg total) by mouth every 8 (eight) hours as needed for nausea or vomiting. (Patient not taking: Reported on 10/24/2021)   potassium chloride SA (KLOR-CON) 20 MEQ tablet Take 1 tablet  (20 mEq total) by mouth 2 (two) times daily. (Patient not taking: Reported on 10/24/2021)   No facility-administered encounter medications on file as of 01/02/2023.      Medical History: Past Medical History:  Diagnosis Date   Hx of varicella    Medical history non-contributory      Vital Signs: Temp 6 F (36.7 C) (Tympanic)    Review of Systems  Constitutional: Negative.   Musculoskeletal: Negative.   Skin:  Positive for color change (bruise left medial knee joint).  Neurological: Negative.     Physical Exam Constitutional:      Appearance: Normal appearance.  HENT:     Head: Normocephalic and atraumatic.     Right Ear: External ear normal.     Left Ear: External ear normal.  Eyes:     Extraocular Movements: Extraocular movements intact.  Musculoskeletal:     Comments: Left Knee: Full ROM. Mildly tender over the medial aspect of the knee joint. No swelling appreciated. No open wounds. Gait is normal. N-V intact. Sensation is grossly normal distally.   Skin:    General: Skin is warm and dry.     Findings: Bruising present. No abrasion, erythema or wound.     Comments: +ecchymosis over the medial aspect of the left knee.   Neurological:     Mental Status: She is alert and oriented to person, place, and time.  Psychiatric:        Mood and  Affect: Mood normal.        Behavior: Behavior normal.       Assessment/Plan:  1. Contusion of left knee, initial encounter  2. Traumatic ecchymosis of left knee, initial encounter  May take otc anti-inflammatory if needed. Apply ice as needed Watch for worsening symptoms or pain. RTC prn Pt verbalized understanding and in agreement.    General Counseling: clea caine understanding of the findings of todays visit and agrees with plan of treatment. I have discussed any further diagnostic evaluation that may be needed or ordered today. We also reviewed her medications today. she has been encouraged to call the  office with any questions or concerns that should arise related to todays visit.    Time spent:20 [place], [place] Physician Assistant

## 2023-01-17 DIAGNOSIS — S060XAA Concussion with loss of consciousness status unknown, initial encounter: Secondary | ICD-10-CM

## 2023-01-17 HISTORY — DX: Concussion with loss of consciousness status unknown, initial encounter: S06.0XAA

## 2023-01-18 ENCOUNTER — Ambulatory Visit (INDEPENDENT_AMBULATORY_CARE_PROVIDER_SITE_OTHER): Payer: Self-pay | Admitting: Adult Health

## 2023-01-18 ENCOUNTER — Encounter: Payer: Self-pay | Admitting: Adult Health

## 2023-01-18 VITALS — BP 114/78 | HR 87 | Temp 97.8°F | Wt 135.8 lb

## 2023-01-18 DIAGNOSIS — S060X0A Concussion without loss of consciousness, initial encounter: Secondary | ICD-10-CM

## 2023-01-18 DIAGNOSIS — W19XXXA Unspecified fall, initial encounter: Secondary | ICD-10-CM

## 2023-01-18 NOTE — Progress Notes (Signed)
[facility] [address], [place] [address]   Office Visit Note  Patient Name: Cynthia Farmer Date of Birth [id number]  Medical Record number [id number]  Date of Service: 01/18/2023  Chief Complaint  Patient presents with   OTHER    Patient fell while ice skating on 01/17/23 and wants to make sure everything is ok and she does not have a concussion as she did hit her head.     HPI Pt is here for acute visit.  She reports while praciticing figure skating yesterday around 3pm, she fell on her right side onto the ice.  She hit the right side of her face, right arm and right hip.    Current Medication:  Outpatient Encounter Medications as of 01/18/2023  Medication Sig   Ascorbic Acid (VITAMIN C) 100 MG CHEW Vitamin C   Cholecalciferol (VITAMIN D) 10 MCG/ML LIQD Vitamin D   XULANE 150-35 MCG/24HR transdermal patch Place 1 patch onto the skin once a week.   clindamycin (CLEOCIN) 300 MG capsule Take 1 capsule (300 mg total) by mouth 3 (three) times daily. (Patient not taking: Reported on 12/31/2022)   clindamycin (CLEOCIN) 300 MG capsule Take 1 capsule (300 mg total) by mouth 3 (three) times daily. (Patient not taking: Reported on 12/31/2022)   docusate sodium (COLACE) 100 MG capsule Take 1 capsule (100 mg total) by mouth 2 (two) times daily. (Patient not taking: Reported on 10/24/2021)   DRYSOL 20 % external solution  (Patient not taking: Reported on 10/24/2021)   fluticasone (FLONASE) 50 MCG/ACT nasal spray Place into both nostrils daily. (Patient not taking: Reported on 12/31/2022)   ibuprofen (ADVIL,MOTRIN) 600 MG tablet Take 1 tablet (600 mg total) by mouth every 6 (six) hours. (Patient not taking: Reported on 10/24/2021)   ondansetron (ZOFRAN) 4 MG tablet Take 1 tablet (4 mg total) by mouth every 8 (eight) hours as needed for nausea or vomiting. (Patient not taking: Reported on 10/24/2021)   potassium chloride SA (KLOR-CON) 20 MEQ tablet Take 1 tablet (20 mEq total) by  mouth 2 (two) times daily. (Patient not taking: Reported on 10/24/2021)   No facility-administered encounter medications on file as of 01/18/2023.      Medical History: Past Medical History:  Diagnosis Date   Hx of varicella    Medical history non-contributory      Vital Signs: BP 114/78   Pulse 87   Temp 97.8 F (36.6 C) (Tympanic)   Wt 135 lb 12.8 oz (61.6 kg)   SpO2 98%   BMI 21.27 kg/m    Review of Systems  Constitutional:  Negative for chills, fatigue and fever.  Musculoskeletal:  Positive for myalgias.       Pain of right side of face, Right forearm, and right hip.      Physical Exam Vitals and nursing note reviewed.  Constitutional:      Appearance: Normal appearance.  Musculoskeletal:     Comments: Some facial pain on right side, near TMJ, no pain with palpation Mild tenderness with flexion of right wrist Right hip soreness. No bruising or redness noted.   Neurological:     Mental Status: She is alert.    Assessment/Plan: 1. Concussion without loss of consciousness, initial encounter See concussion note  2. Fall, initial encounter Take advil or other NSAID as discussed.  Follow up via MyChart messenger if symptoms fail to improve or may return to clinic as needed for worsening symptoms.       General  Counseling: Cynthia Farmer understanding of the findings of todays visit and agrees with plan of treatment. I have discussed any further diagnostic evaluation that may be needed or ordered today. We also reviewed her medications today. she has been encouraged to call the office with any questions or concerns that should arise related to todays visit.   No orders of the defined types were placed in this encounter.   No orders of the defined types were placed in this encounter.   Time spent:30 Minutes    Kendell Bane AGNP-C Nurse Practitioner

## 2023-01-18 NOTE — Progress Notes (Signed)
[address] [place], [place] [address] Phone: [phone] Fax: [phone]   Office Visit Note  Patient Name: Cynthia Farmer  Date of Birth [id number]  Med Record Number [id number]  Date of Service: 01/18/2023  Chief Complaint  Patient presents with   OTHER    Patient fell while ice skating on 01/17/23 and wants to make sure everything is ok and she does not have a concussion as she did hit her head.    Vital Signs: BP 114/78   Pulse 87   Temp 97.8 F (36.6 C) (Tympanic)   Wt 135 lb 12.8 oz (61.6 kg)   SpO2 98%   BMI 21.27 kg/m   Azithromycin, Ceclor [cefaclor], and Amoxicillin  Current Medication:  Outpatient Encounter Medications as of 01/18/2023  Medication Sig   Ascorbic Acid (VITAMIN C) 100 MG CHEW Vitamin C   Cholecalciferol (VITAMIN D) 10 MCG/ML LIQD Vitamin D   XULANE 150-35 MCG/24HR transdermal patch Place 1 patch onto the skin once a week.   clindamycin (CLEOCIN) 300 MG capsule Take 1 capsule (300 mg total) by mouth 3 (three) times daily. (Patient not taking: Reported on 12/31/2022)   clindamycin (CLEOCIN) 300 MG capsule Take 1 capsule (300 mg total) by mouth 3 (three) times daily. (Patient not taking: Reported on 12/31/2022)   docusate sodium (COLACE) 100 MG capsule Take 1 capsule (100 mg total) by mouth 2 (two) times daily. (Patient not taking: Reported on 10/24/2021)   DRYSOL 20 % external solution  (Patient not taking: Reported on 10/24/2021)   fluticasone (FLONASE) 50 MCG/ACT nasal spray Place into both nostrils daily. (Patient not taking: Reported on 12/31/2022)   ibuprofen (ADVIL,MOTRIN) 600 MG tablet Take 1 tablet (600 mg total) by mouth every 6 (six) hours. (Patient not taking: Reported on 10/24/2021)   ondansetron (ZOFRAN) 4 MG tablet Take 1 tablet (4 mg total) by mouth every 8 (eight) hours as needed for nausea or vomiting. (Patient not taking: Reported on 10/24/2021)   potassium chloride SA (KLOR-CON) 20 MEQ tablet Take 1  tablet (20 mEq total) by mouth 2 (two) times daily. (Patient not taking: Reported on 10/24/2021)   No facility-administered encounter medications on file as of 01/18/2023.    Medical History: Past Medical History:  Diagnosis Date   Hx of varicella    Medical history non-contributory     Acute Concussion Evaluation (ACE)  Date/Time of Injury 01/17/23 Reported by Patient  Injury Description fell on right side while figure skating  Is there evidence of a forcible blow to the head (direct or indirect)? No Evidence of Intracranial injury or skill fracture? No Location of impact Right Parietal Causes Fall  Amnesia Before (Retrograde) Are there events just BEFORE the injury that you/person has no memory of (even brief)? Yes Amnesia After (Anterograde) Are there any events just AFTER the injury that you/person has no memory of (even brief)? No Loss of Consciousness No Early signs: N/A Seizures: No  Headache: 0  Nausea: 1  Vomiting: 0  Balance Difficulty: 1  Dizziness: 0  Visual Problems:0  Fatigue: 1  Sensitivity to light: 0  Sensitivity to noise: 0  Numbness/Tingling: 1    Daytime Drowsiness: 1  Sleep Less Than Usual: 1  Sleep More Than Usual: 0  Trouble Falling Asleep: 0    Irritability: 0  Sadness: 0  Feeling More Emotional: 0  Nervousness: 0    Feeling Mentally Foggy: 0  Feeling Slowed Down: 0  Difficulty Concentrating: 0  Difficulty Remembering:  1   Total Symptom Score: 7  Does Exertion/Physical Activity makes symptoms worse? No Does Cognitive Activity (reading, writing, studying) make symptoms worse?No Overall Rating: How different is the person acting compared to his/her usual self? 3  Risk Factors for Protracted Recovery  History of Concussion? No How many Previous Concussions? 0 Longest symptom duration N/A If multiple concussions, was less force needed to cause reinjury? No History of Headaches?No Prior treatment for headaches?No Personal History  of Migraines?No Family History of MigrainesNo   History of Learning Disabilities?No History of ADD or ADHD?No Other Developmental disorders? No   History of Anxiety?No History of Depression?No Sleep Disorder?No Other Psychiatric DisorderNo    Physical EXAM Physical Exam Vitals and nursing note reviewed.  Constitutional:      Appearance: Normal appearance.  Neurological:     General: No focal deficit present.     Mental Status: She is alert and oriented to person, place, and time.     Motor: No weakness.  Psychiatric:        Mood and Affect: Mood normal.        Behavior: Behavior normal.        Thought Content: Thought content normal.    Assessment/Plan: 1. Concussion without loss of consciousness, initial encounter Findings consistent with Concussion/mild TBI.   Discussed with patient the importance of physical and mental rest for recovery.  Reviewed Head Injury protocol, including limiting screen time, frequent rest breaks, cognitive rest. Avoid physical activity, bright light, loud noise and alcohol consumption. May take tylenol for headache. Encouraged patient to rest, take naps as needed and drink plenty of water.   Refer to the emergency department with sudden onset of any of the following; Headaches that worsen, seizures, looks very drowsy/can't be awakened, repeated vomiting, slurred speech, can't recognize people or places, increasing confusion or irritability, weakness or numbness in arms/legs, neck pain, unusual behavior change, or change in state of consciousness.     2. Fall, initial encounter Take ibuprofen/tylenol  as discussed.       General Counseling: tigerlily lumia understanding of the findings of todays visit and agrees with plan of treatment. I have discussed any further diagnostic evaluation that may be needed or ordered today. We also reviewed her medications today. she has been encouraged to call the office with any questions or concerns that should  arise related to todays visit.   No orders of the defined types were placed in this encounter.   No orders of the defined types were placed in this encounter.   Time spent:30 Minutes Time spent includes review of chart, medications, test results, and follow up plan with the patient.    Kendell Bane AGNP-C Nurse Practitioner

## 2023-01-21 ENCOUNTER — Encounter: Payer: Self-pay | Admitting: *Deleted

## 2023-01-21 DIAGNOSIS — E559 Vitamin D deficiency, unspecified: Secondary | ICD-10-CM | POA: Diagnosis not present

## 2023-01-21 DIAGNOSIS — Z713 Dietary counseling and surveillance: Secondary | ICD-10-CM | POA: Diagnosis not present

## 2023-01-22 ENCOUNTER — Other Ambulatory Visit (INDEPENDENT_AMBULATORY_CARE_PROVIDER_SITE_OTHER): Payer: BC Managed Care – PPO

## 2023-01-22 ENCOUNTER — Ambulatory Visit: Payer: BC Managed Care – PPO | Admitting: Internal Medicine

## 2023-01-22 ENCOUNTER — Encounter: Payer: Self-pay | Admitting: Internal Medicine

## 2023-01-22 VITALS — BP 102/62 | HR 75 | Ht 67.0 in | Wt 138.0 lb

## 2023-01-22 DIAGNOSIS — R143 Flatulence: Secondary | ICD-10-CM

## 2023-01-22 DIAGNOSIS — R140 Abdominal distension (gaseous): Secondary | ICD-10-CM | POA: Diagnosis not present

## 2023-01-22 DIAGNOSIS — R109 Unspecified abdominal pain: Secondary | ICD-10-CM

## 2023-01-22 DIAGNOSIS — R195 Other fecal abnormalities: Secondary | ICD-10-CM

## 2023-01-22 DIAGNOSIS — R110 Nausea: Secondary | ICD-10-CM

## 2023-01-22 LAB — CBC WITH DIFFERENTIAL/PLATELET
Basophils Absolute: 0 10*3/uL (ref 0.0–0.1)
Basophils Relative: 0.8 % (ref 0.0–3.0)
Eosinophils Absolute: 0.1 10*3/uL (ref 0.0–0.7)
Eosinophils Relative: 1.4 % (ref 0.0–5.0)
HCT: 40.7 % (ref 36.0–46.0)
Hemoglobin: 13.6 g/dL (ref 12.0–15.0)
Lymphocytes Relative: 48.9 % — ABNORMAL HIGH (ref 12.0–46.0)
Lymphs Abs: 2 10*3/uL (ref 0.7–4.0)
MCHC: 33.3 g/dL (ref 30.0–36.0)
MCV: 85.6 fl (ref 78.0–100.0)
Monocytes Absolute: 0.4 10*3/uL (ref 0.1–1.0)
Monocytes Relative: 10.3 % (ref 3.0–12.0)
Neutro Abs: 1.6 10*3/uL (ref 1.4–7.7)
Neutrophils Relative %: 38.6 % — ABNORMAL LOW (ref 43.0–77.0)
Platelets: 243 10*3/uL (ref 150.0–400.0)
RBC: 4.76 Mil/uL (ref 3.87–5.11)
RDW: 13 % (ref 11.5–15.5)
WBC: 4.1 10*3/uL (ref 4.0–10.5)

## 2023-01-22 LAB — COMPREHENSIVE METABOLIC PANEL
ALT: 32 U/L (ref 0–35)
AST: 21 U/L (ref 0–37)
Albumin: 4.2 g/dL (ref 3.5–5.2)
Alkaline Phosphatase: 66 U/L (ref 39–117)
BUN: 13 mg/dL (ref 6–23)
CO2: 22 mEq/L (ref 19–32)
Calcium: 9.1 mg/dL (ref 8.4–10.5)
Chloride: 104 mEq/L (ref 96–112)
Creatinine, Ser: 0.79 mg/dL (ref 0.40–1.20)
GFR: 94.45 mL/min (ref >60.00)
Glucose, Bld: 86 mg/dL (ref 70–99)
Potassium: 3.9 mEq/L (ref 3.5–5.1)
Sodium: 136 mEq/L (ref 135–145)
Total Bilirubin: 0.5 mg/dL (ref 0.2–1.2)
Total Protein: 7.2 g/dL (ref 6.0–8.3)

## 2023-01-22 LAB — C-REACTIVE PROTEIN: CRP: <1 mg/dL (ref 0.5–20.0)

## 2023-01-22 LAB — TSH: TSH: 1.41 u[IU]/mL (ref 0.35–5.50)

## 2023-01-22 NOTE — Progress Notes (Signed)
Patient ID: Cynthia Farmer, female   DOB: May 10, 1986, 37 y.o.   MRN: [id number] HPI: Cynthia Farmer is a 37 year old female with little past medical history who is seen in consultation at the request of Dr. Nancy Fetter and Dr. Harold Hedge to evaluate abdominal bloating, crampy abdominal discomfort and nausea.  She is here alone today.  She is also getting over a concussion which occurred recently after figure skating.  She states that she has been having a couple years of abdominal bloating with gas and belching.  She initially cut out lactose which seemed to help but then even with the milk alternatives she was having similar symptoms.  Slowly over time she has eliminated nuts, gluten, tomatoes, yeast.  She had 2 attacks 1 severe of extreme crampy abdominal pain since Christmas Day 2023.  After these attacks she has reduced her diet to bland foods like chicken and rice.  She had episode of diarrhea and vomiting after eating a roll in mid January.  She had been gluten-free prior to this since Christmas.  She does not have heartburn but has tightness in her chest with certain foods.  Her bowel movements can change based on food and with some of her dietary changes she felt constipated but has otherwise bouts of loose stool.  Most of her gas and bloating feels upper GI with belching though she does have some lower intestinal gas as well.  She feels that the preponderance of her symptoms started after COVID-19 infection in December 2021.  She is married with 1 daughter.  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  Despite her medical list below she is really only taking vitamin C and D of late.  She has stopped her transdermal birth control patch Xulane  Prior allergy testing revealed dust mite and mold allergy but she has not yet had food allergy testing.  Past Medical History:  Diagnosis Date   Concussion 01/17/2023   while figure skating   Hx of varicella    Hyperlipidemia     Past  Surgical History:  Procedure Laterality Date   stitches post-3rd degree tear during child birth 2015  2015    Outpatient Medications Prior to Visit  Medication Sig Dispense Refill   Ascorbic Acid (VITAMIN C) 100 MG CHEW Vitamin C     Cholecalciferol (VITAMIN D) 10 MCG/ML LIQD Vitamin D     XULANE 150-35 MCG/24HR transdermal patch Place 1 patch onto the skin once a week.     clindamycin (CLEOCIN) 300 MG capsule Take 1 capsule (300 mg total) by mouth 3 (three) times daily. (Patient not taking: Reported on 12/31/2022) 21 capsule 0   clindamycin (CLEOCIN) 300 MG capsule Take 1 capsule (300 mg total) by mouth 3 (three) times daily. (Patient not taking: Reported on 12/31/2022) 21 capsule 0   docusate sodium (COLACE) 100 MG capsule Take 1 capsule (100 mg total) by mouth 2 (two) times daily. (Patient not taking: Reported on 10/24/2021) 40 capsule 2   DRYSOL 20 % external solution  (Patient not taking: Reported on 10/24/2021)     fluticasone (FLONASE) 50 MCG/ACT nasal spray Place into both nostrils daily. (Patient not taking: Reported on 12/31/2022)     ibuprofen (ADVIL,MOTRIN) 600 MG tablet Take 1 tablet (600 mg total) by mouth every 6 (six) hours. (Patient not taking: Reported on 10/24/2021) 30 tablet 0   ondansetron (ZOFRAN) 4 MG tablet Take 1 tablet (4 mg total) by mouth every 8 (eight) hours as needed for nausea  or vomiting. (Patient not taking: Reported on 10/24/2021) 8 tablet 0   potassium chloride SA (KLOR-CON) 20 MEQ tablet Take 1 tablet (20 mEq total) by mouth 2 (two) times daily. (Patient not taking: Reported on 10/24/2021) 12 tablet 0   No facility-administered medications prior to visit.    Allergies  Allergen Reactions   Azithromycin Itching   Ceclor [Cefaclor] Swelling   Amoxicillin Rash    Family History  Problem Relation Age of Onset   Heart disease Father    Heart disease Maternal Grandfather    Heart disease Paternal Grandfather    Colon cancer Neg Hx    Esophageal cancer  Neg Hx     Social History   Tobacco Use   Smoking status: Never   Smokeless tobacco: Never  Vaping Use   Vaping Use: Never used  Substance Use Topics   Alcohol use: No   Drug use: No    ROS: As per history of present illness, otherwise negative  BP 102/62   Pulse 75   Ht 5\' 7"  (1.702 m)   Wt 138 lb (62.6 kg)   BMI 21.61 kg/m  Gen: awake, alert, NAD HEENT: anicteric, OP clear CV: RRR, no mrg Pulm: CTA b/l Abd: soft, NT/ND, +BS throughout Ext: no c/c/e Neuro: nonfocal  RELEVANT LABS AND IMAGING: CBC    Component Value Date/Time   WBC 4.1 01/22/2023 1525   RBC 4.76 01/22/2023 1525   HGB 13.6 01/22/2023 1525   HCT 40.7 01/22/2023 1525   PLT 243.0 01/22/2023 1525   MCV 85.6 01/22/2023 1525   MCH 29.4 02/13/2020 0235   MCHC 33.3 01/22/2023 1525   RDW 13.0 01/22/2023 1525   LYMPHSABS 2.0 01/22/2023 1525   MONOABS 0.4 01/22/2023 1525   EOSABS 0.1 01/22/2023 1525   BASOSABS 0.0 01/22/2023 1525    CMP     Component Value Date/Time   NA 136 01/22/2023 1525   K 3.9 01/22/2023 1525   CL 104 01/22/2023 1525   CO2 22 01/22/2023 1525   GLUCOSE 86 01/22/2023 1525   BUN 13 01/22/2023 1525   CREATININE 0.79 01/22/2023 1525   CALCIUM 9.1 01/22/2023 1525   PROT 7.2 01/22/2023 1525   ALBUMIN 4.2 01/22/2023 1525   AST 21 01/22/2023 1525   ALT 32 01/22/2023 1525   ALKPHOS 66 01/22/2023 1525   BILITOT 0.5 01/22/2023 1525   GFRNONAA >60 02/13/2020 0235   GFRAA >60 02/13/2020 0235    ASSESSMENT/PLAN: 37 year old female with little past medical history who is seen in consultation at the request of Dr. Nancy Fetter and Dr. Harold Hedge to evaluate abdominal bloating, crampy abdominal discomfort and nausea.  Abdominal bloating/crampy abdominal pain/occasional nausea/altered bowel habits --we discussed her symptoms today and I agree it does not sound like a true food allergy though I cannot exclude celiac disease.  In the differential includes SIBO, microbiome disruption post COVID,  celiac disease, sugar malabsorption.  I have recommended the following to start: -- Celiac test with TTG but also celiac DNA -- CBC with differential, attention to eosinophils -- CMP -- TSH -- CRP -- SIBO breath test -- H. pylori stool antigen and ova and parasite to exclude Giardia -- If unrevealing we could consider sucrase breath testing to see if there is a sucrase malabsorption though these test can be cumbersome in the diagnosis could be elusive. -- Follow-up in 2 or 3 months -- She did see a dietitian who recommended digestive enzymes which I am not opposed to after hydrogen breath  testing     Cc:[place], [place], [facility] [address] [place],  [place] [address]  Azzie Roup, MD

## 2023-01-22 NOTE — Patient Instructions (Signed)
Your provider has requested that you go to the basement level for lab work before leaving today. Press "B" on the elevator. The lab is located at the first door on the left as you exit the elevator.  You have been given a testing kit to check for small intestine bacterial overgrowth (SIBO) which is completed by a company named [facility]. Make sure to return your test in the mail using the return mailing label given to you along with the kit. Your demographic and insurance information have already been sent to the company and they should be in contact with you over the next 1-2 weeks regarding this test. [facility] will collect an upfront charge of $99.74 for commercial insurance plans and $209.74 is you are paying cash. Make sure to discuss with [facility] PRIOR to having the test to see if they have gotten information from your insurance company as to how much your testing will cost out of pocket, if any. Please keep in mind that you will be getting a call from phone number [phone] or a similar number. If you do not hear from them within this time frame, please call our office at [phone] or call [facility] directly at [phone].   _______________________________________________________  If your blood pressure at your visit was 140/90 or greater, please contact your primary care physician to follow up on this.  _______________________________________________________  If you are age 36 or older, your body mass index should be between 23-30. Your Body mass index is 21.61 kg/m. If this is out of the aforementioned range listed, please consider follow up with your Primary Care Provider.  If you are age 83 or younger, your body mass index should be between 19-25. Your Body mass index is 21.61 kg/m. If this is out of the aformentioned range listed, please consider follow up with your Primary Care Provider.    ________________________________________________________  The [facility] providers would like to encourage you to use [facility] to communicate with providers for non-urgent requests or questions.  Due to long hold times on the telephone, sending your provider a message by [facility] may be a faster and more efficient way to get a response.  Please allow 48 business hours for a response.  Please remember that this is for non-urgent requests.  _______________________________________________________

## 2023-01-23 ENCOUNTER — Encounter: Payer: Self-pay | Admitting: Adult Health

## 2023-01-23 ENCOUNTER — Ambulatory Visit (INDEPENDENT_AMBULATORY_CARE_PROVIDER_SITE_OTHER): Payer: Self-pay | Admitting: Adult Health

## 2023-01-23 ENCOUNTER — Encounter: Payer: Self-pay | Admitting: Internal Medicine

## 2023-01-23 VITALS — BP 122/68 | HR 101 | Temp 98.1°F

## 2023-01-23 DIAGNOSIS — S060X0A Concussion without loss of consciousness, initial encounter: Secondary | ICD-10-CM

## 2023-01-23 DIAGNOSIS — R140 Abdominal distension (gaseous): Secondary | ICD-10-CM | POA: Diagnosis not present

## 2023-01-23 DIAGNOSIS — R110 Nausea: Secondary | ICD-10-CM | POA: Diagnosis not present

## 2023-01-23 DIAGNOSIS — R143 Flatulence: Secondary | ICD-10-CM | POA: Diagnosis not present

## 2023-01-23 DIAGNOSIS — R195 Other fecal abnormalities: Secondary | ICD-10-CM | POA: Diagnosis not present

## 2023-01-23 LAB — TISSUE TRANSGLUTAMINASE ABS,IGG,IGA
(tTG) Ab, IgA: <1 U/mL
(tTG) Ab, IgG: <1 U/mL

## 2023-01-23 NOTE — Progress Notes (Signed)
[facility] [address] [place], [place] [address]    Office Visit Note  Patient Name: Cynthia Farmer  Date of Birth [id number]  Med Record Number [id number]  Date of Service: 01/23/2023  Chief Complaint  Patient presents with   Follow-up    Concussion    Vital Signs: BP 122/68   Pulse (!) 101   Temp 98.1 F (36.7 C) (Tympanic)   SpO2 100%   Azithromycin, Ceclor [cefaclor], and Amoxicillin  Current Medication:  Outpatient Encounter Medications as of 01/23/2023  Medication Sig   Ascorbic Acid (VITAMIN C) 100 MG CHEW Vitamin C   Cholecalciferol (VITAMIN D) 10 MCG/ML LIQD Vitamin D   XULANE 150-35 MCG/24HR transdermal patch Place 1 patch onto the skin once a week.   No facility-administered encounter medications on file as of 01/23/2023.    Medical History: Past Medical History:  Diagnosis Date   Concussion 01/17/2023   while figure skating   Hx of varicella    Hyperlipidemia     Acute Concussion Evaluation (ACE)  Date/Time of Injury 01/17/23 Reported by Patient  Injury Description [place] on right side while figure skating  Is there evidence of a forcible blow to the head (direct or indirect)? No Evidence of Intracranial injury or skill fracture? No Location of impact Right Parietal Causes Fall  Amnesia Before (Retrograde) Are there events just BEFORE the injury that you/person has no memory of (even brief)? No Amnesia After (Anterograde) Are there any events just AFTER the injury that you/person has no memory of (even brief)? No Loss of Consciousness No Early signs: N/A Seizures: No  Headache: 0  Nausea: 0  Vomiting: 0  Balance Difficulty: 0  Dizziness: 1  Visual Problems:0  Fatigue: 0  Sensitivity to light: 1  Sensitivity to noise: 0  Numbness/Tingling: 0    Daytime Drowsiness: 0  Sleep Less Than Usual: 0  Sleep More Than Usual: 0  Trouble Falling Asleep: 0    Irritability: 0  Sadness: 0  Feeling More Emotional: 0  Nervousness: 0     Feeling Mentally Foggy: 1  Feeling Slowed Down: 0  Difficulty Concentrating: 0  Difficulty Remembering: 0   Total Symptom Score: 3  Does Exertion/Physical Activity makes symptoms worse? No Does Cognitive Activity (reading, writing, studying) make symptoms worse?Yes Overall Rating: How different is the person acting compared to his/her usual self? 2  Risk Factors for Protracted Recovery  History of Concussion? No How many Previous Concussions? 0 Longest symptom duration N/A If multiple concussions, was less force needed to cause reinjury? No History of Headaches?No Prior treatment for headaches?No Personal History of Migraines?No Family History of MigrainesNo   History of Learning Disabilities?No History of ADD or ADHD?No Other Developmental disorders? No   History of Anxiety?No History of Depression?No Sleep Disorder?No Other Psychiatric DisorderNo    Physical EXAM Physical Exam Vitals and nursing note reviewed.  Constitutional:      Appearance: Normal appearance.  HENT:     Head: Normocephalic.  Eyes:     Pupils: Pupils are equal, round, and reactive to light.  Neurological:     General: No focal deficit present.     Mental Status: She is alert and oriented to person, place, and time.     Cranial Nerves: No cranial nerve deficit.     Sensory: No sensory deficit.    Assessment/Plan: 1. Concussion without loss of consciousness, initial encounter Ace score down to 3 from 7 Findings consistent with Concussion/mild TBI.  Discussed with  patient the importance of physical and mental rest for recovery.  Reviewed Head Injury protocol, including limiting screen time, frequent rest breaks, cognitive rest. Avoid physical activity, bright light, loud noise and alcohol consumption. May take tylenol for headache. Encouraged patient to rest, take naps as needed and drink plenty of water.   Refer to the emergency department with sudden onset of any of the following; Headaches  that worsen, seizures, looks very drowsy/can't be awakened, repeated vomiting, slurred speech, can't recognize people or places, increasing confusion or irritability, weakness or numbness in arms/legs, neck pain, unusual behavior change, or change in state of consciousness.        General Counseling: melony franceschi understanding of the findings of todays visit and agrees with plan of treatment. I have discussed any further diagnostic evaluation that may be needed or ordered today. We also reviewed her medications today. she has been encouraged to call the office with any questions or concerns that should arise related to todays visit.   No orders of the defined types were placed in this encounter.   No orders of the defined types were placed in this encounter.   Time spent:20 Minutes Time spent includes review of chart, medications, test results, and follow up plan with the patient.    Kendell Bane AGNP-C Nurse Practitioner

## 2023-01-25 LAB — H. PYLORI ANTIGEN, STOOL: H pylori Ag, Stl: NEGATIVE

## 2023-01-28 LAB — CELIAC DISEASE HLA DQ ASSOC.
DQ2 (DQA1 0501/0505,DQB1 02XX): NEGATIVE
DQ8 (DQA1 03XX, DQB1 0302): POSITIVE

## 2023-01-29 ENCOUNTER — Ambulatory Visit (INDEPENDENT_AMBULATORY_CARE_PROVIDER_SITE_OTHER): Payer: Self-pay | Admitting: Physician Assistant

## 2023-01-29 DIAGNOSIS — S060X0A Concussion without loss of consciousness, initial encounter: Secondary | ICD-10-CM

## 2023-01-29 LAB — OVA AND PARASITE EXAMINATION

## 2023-01-29 NOTE — Progress Notes (Signed)
Virtual Visit Consent   Cynthia Farmer, you are scheduled for a virtual visit with a [facility] provider today. Just as with appointments in the office, your consent must be obtained to participate. Your consent will be active for this visit and any virtual visit you may have with one of our providers in the next 365 days. If you have a MyChart account, a copy of this consent can be sent to you electronically.  As this is a virtual telephone visit which doesn't allow your provider to perform a traditional examination and may limit your provider's ability to fully assess your condition. If your provider identifies any concerns that need to be evaluated in person or the need to arrange testing (such as labs, EKG, etc.), we will make arrangements to do so. Although advances in technology are sophisticated, we cannot ensure that it will always work on either your end or our end. If the connection with a video visit is poor, the visit may have to be switched to a telephone visit. With either a video or telephone visit, we are not always able to ensure that we have a secure connection.  By engaging in this virtual visit, you consent to the provision of healthcare and authorize for your insurance to be billed (if applicable) for the services provided during this visit. Depending on your insurance coverage, you may receive a charge related to this service.  I need to obtain your verbal consent now. Are you willing to proceed with your visit today? Cynthia Farmer has provided verbal consent on 01/29/2023 for a virtual visit (video or telephone). Cynthia Farmer, [place]  Date: 01/29/2023 2:21 PM  Virtual Visit via Telephone Note   I, Cynthia Farmer, connected with  Haivyn Buchwald  ([id number], December 30, 1985) on 01/29/23 at  2:00 PM EDT by a telephone and verified that I am speaking with the correct person using two identifiers.  Location: Patient: Virtual Visit Location Patient: Home Provider: Virtual Visit Location  Provider: Office/Clinic   I discussed the limitations of evaluation and management by telemedicine and the availability of in person appointments. The patient expressed understanding and agreed to proceed.    History of Present Illness: Cynthia Farmer is a 37 y.o. who identifies as a female who was assigned female at birth, and is being seen today for concussion.  HPI: 37 y/o F contacts [place] via telehealth for a follow up for concussion. She was feeling better up to yesterday. Yesterday she was peaking into her daughter's room when her daughter accidentally closed the door on her head (L temple area) and her head felt slightly worse again.  Pain scale of 1-2/10.  She describes it as a "headache type of feeling". Denies nausea, vomiting, dizziness, worsening headache, etc.  Did not take any otc medicine for her headache. Hasn't taken any medicine today. She worked half a day yesterday and again today.     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   Extensor tenosynovitis of wrist 06/24/2015   Vaginal delivery 08/09/2014   Third degree perineal laceration 08/09/2014    Allergies:  Allergies  Allergen Reactions   Azithromycin Itching   Ceclor [Cefaclor] Swelling   Amoxicillin Rash   Medications:  Current Outpatient Medications:    Ascorbic Acid (VITAMIN C) 100 MG CHEW, Vitamin C, Disp: , Rfl:    Cholecalciferol (VITAMIN D) 10 MCG/ML LIQD, Vitamin D, Disp: , Rfl:    XULANE 150-35 MCG/24HR transdermal  patch, Place 1 patch onto the skin once a week., Disp: , Rfl:   Observations/Objective: Patient is well-developed, well-nourished in no acute distress.  Resting comfortably  at home.  Head is normocephalic, atraumatic.  No labored breathing.  Speech is clear and coherent with logical content.  Patient is alert and oriented at baseline.    Assessment and Plan: 1. Concussion without loss of consciousness, initial  encounter  Reviewed concussion symptoms including dizziness, nausea, vomiting, photosensitivity, or worsening headache then go to ER immediately. Limit the use of electronic items ie computers, tv, phones, etc. Limit strenuous activities.   Continue to watch for worsening symptoms. May take Tylenol for headache if needed.  Follow up in 3 days or sooner if any difficulty arises.  Pt verbalized understanding and in agreement.    Follow Up Instructions: I discussed the assessment and treatment plan with the patient. The patient was provided an opportunity to ask questions and all were answered. The patient agreed with the plan and demonstrated an understanding of the instructions.  A copy of instructions were sent to the patient via MyChart unless otherwise noted below.    The patient was advised to call back or seek an in-person evaluation if the symptoms worsen or if the condition fails to improve as anticipated.  Time:  I spent 10 minutes with the patient via telehealth technology discussing the above problems/concerns.    Cynthia Acosta, PA-C

## 2023-02-05 ENCOUNTER — Ambulatory Visit (INDEPENDENT_AMBULATORY_CARE_PROVIDER_SITE_OTHER): Payer: Self-pay | Admitting: Physician Assistant

## 2023-02-05 VITALS — BP 106/72 | HR 77 | Temp 97.6°F | Resp 14 | Wt 134.6 lb

## 2023-02-05 DIAGNOSIS — R42 Dizziness and giddiness: Secondary | ICD-10-CM

## 2023-02-05 DIAGNOSIS — S060X0D Concussion without loss of consciousness, subsequent encounter: Secondary | ICD-10-CM

## 2023-02-05 NOTE — Progress Notes (Signed)
[facility] [address] [place], [place] [address]   Office Visit Note  Patient Name: Cynthia Farmer Date of Birth [id number]  Medical Record number [id number]  Date of Service: 02/05/2023  Chief Complaint  Patient presents with   Follow-up    Concussion     37 y/o F presents to the clinic for c/o feeling dizzy. Pt with h/o recent concussion. However she was feeling well last week while she was away from the house. Her dizziness started yesterday. She is back at work and on computer most of the day. No new head or neck trauma. No dental procedures. No airplane travel recently. No open water activities. Pt does tell me that her diet is very restricted and eats about 5 things because she's only able to tolerate these things. She is being worked up by GI currently awaiting blood test results. She has also seen a dietician and will follow up after GI test results.+h/o allergies. Known allergies to dust mites.She tells me that there is mold in her house and when she was away from her house she felt better.       Current Medication:  Outpatient Encounter Medications as of 02/05/2023  Medication Sig   Ascorbic Acid (VITAMIN C) 100 MG CHEW Vitamin C   Cholecalciferol (VITAMIN D) 10 MCG/ML LIQD Vitamin D   XULANE 150-35 MCG/24HR transdermal patch Place 1 patch onto the skin once a week.   No facility-administered encounter medications on file as of 02/05/2023.      Medical History: Past Medical History:  Diagnosis Date   Concussion 01/17/2023   while figure skating   Hx of varicella    Hyperlipidemia      Vital Signs: BP 106/72   Pulse 77   Temp 97.6 F (36.4 C) (Oral)   Resp 14   Wt 134 lb 9.6 oz (61.1 kg)   SpO2 100%   BMI 21.08 kg/m    Review of Systems  Constitutional: Negative.   HENT: Negative.    Respiratory: Negative.    Neurological:  Positive for dizziness. Negative for speech difficulty, weakness, light-headedness, numbness and headaches.     Physical Exam Constitutional:      Appearance: Normal appearance.  HENT:     Head: Atraumatic.     Right Ear: Tympanic membrane, ear canal and external ear normal.     Left Ear: Tympanic membrane, ear canal and external ear normal.     Nose: Nose normal.     Mouth/Throat:     Mouth: Mucous membranes are moist.     Pharynx: Oropharynx is clear.  Eyes:     Extraocular Movements: Extraocular movements intact.  Cardiovascular:     Rate and Rhythm: Normal rate and regular rhythm.  Pulmonary:     Effort: Pulmonary effort is normal.     Breath sounds: Normal breath sounds.  Musculoskeletal:     Cervical back: Neck supple.  Skin:    General: Skin is warm.  Neurological:     Mental Status: She is alert and oriented to person, place, and time.     Coordination: Romberg sign negative.  Psychiatric:        Mood and Affect: Mood normal.        Behavior: Behavior normal.        Thought Content: Thought content normal.        Judgment: Judgment normal.       Assessment/Plan:  1. Dizzy - Iron, TIBC and Ferritin Panel - Comprehensive  metabolic panel - Hemoglobin A1c  2. Concussion without loss of consciousness, subsequent encounter  Reviewed my clinical findings with patient.  Follow up with GI for additional test results. Follow up with Dietician to add more foods in diet that she can tolerate. I will request iron test to r/o feeling dizzy. Reviewed recent blood test by GI. Normal thyroid level. May suspicion is that she may not be eating enough and thus feeling this way or may potentially be related to mold in the house. Follow up with PCP for further evaluation. Pt verbalized understanding and in agreement.     General Counseling: milagros bermeo understanding of the findings of todays visit and agrees with plan of treatment. I have discussed any further diagnostic evaluation that may be needed or ordered today. We also reviewed her medications today. she has been  encouraged to call the office with any questions or concerns that should arise related to todays visit.    Time spent:30 [place], [place] Physician Assistant

## 2023-02-06 LAB — SPECIMEN STATUS

## 2023-02-06 LAB — COMPREHENSIVE METABOLIC PANEL
ALT: 39 IU/L — ABNORMAL HIGH (ref 0–32)
AST: 22 IU/L (ref 0–40)
Albumin/Globulin Ratio: 1.7 (ref 1.2–2.2)
Albumin: 4.5 g/dL (ref 3.9–4.9)
Alkaline Phosphatase: 82 IU/L (ref 44–121)
BUN/Creatinine Ratio: 14 (ref 9–23)
BUN: 10 mg/dL (ref 6–20)
Bilirubin Total: 0.4 mg/dL (ref 0.0–1.2)
CO2: 23 mmol/L (ref 20–29)
Calcium: 9.1 mg/dL (ref 8.7–10.2)
Chloride: 101 mmol/L (ref 96–106)
Creatinine, Ser: 0.69 mg/dL (ref 0.57–1.00)
Globulin, Total: 2.7 g/dL (ref 1.5–4.5)
Glucose: 97 mg/dL (ref 70–99)
Potassium: 4.5 mmol/L (ref 3.5–5.2)
Sodium: 137 mmol/L (ref 134–144)
Total Protein: 7.2 g/dL (ref 6.0–8.5)
eGFR: 113 mL/min/{1.73_m2} (ref >59)

## 2023-02-07 DIAGNOSIS — R195 Other fecal abnormalities: Secondary | ICD-10-CM | POA: Diagnosis not present

## 2023-02-07 DIAGNOSIS — R110 Nausea: Secondary | ICD-10-CM | POA: Diagnosis not present

## 2023-02-07 DIAGNOSIS — R143 Flatulence: Secondary | ICD-10-CM | POA: Diagnosis not present

## 2023-02-07 DIAGNOSIS — R140 Abdominal distension (gaseous): Secondary | ICD-10-CM | POA: Diagnosis not present

## 2023-02-07 LAB — FERRITIN: Ferritin: 67 ng/mL (ref 15–150)

## 2023-02-07 LAB — IRON AND TIBC
Iron Saturation: 25 % (ref 15–55)
Iron: 85 ug/dL (ref 27–159)
Total Iron Binding Capacity: 338 ug/dL (ref 250–450)
UIBC: 253 ug/dL (ref 131–425)

## 2023-02-07 LAB — HGB A1C W/O EAG: Hgb A1c MFr Bld: 5.6 % (ref 4.8–5.6)

## 2023-02-07 LAB — SPECIMEN STATUS REPORT

## 2023-02-19 ENCOUNTER — Telehealth: Payer: Self-pay | Admitting: *Deleted

## 2023-02-19 MED ORDER — RIFAXIMIN 550 MG PO TABS: 550.0000 mg | ORAL_TABLET | Freq: Three times a day (TID) | ORAL | 0 refills | Status: AC

## 2023-02-19 NOTE — Telephone Encounter (Signed)
SIBO test is positive. Sent script for Xifaxan to pharmacy. Patient informed of results.

## 2023-02-20 ENCOUNTER — Telehealth: Payer: Self-pay | Admitting: Internal Medicine

## 2023-02-20 NOTE — Telephone Encounter (Signed)
Spoke with pt and she is aware of Dr. Pyrtle's recommendations. 

## 2023-02-20 NOTE — Telephone Encounter (Signed)
PT took Xifaxin for first time yesterday and it made her feel funny. Tightness in chest and muscle spasms. She is concerned on whether she should continue to take the medication. Please advise.

## 2023-02-20 NOTE — Telephone Encounter (Signed)
Uncommon and not sure this is related to the treatment esp after only 1 dose Would try again given only 1 dose thus far Of course if recurrent symptoms then let me know, also if swelling in lips, mouth, throat, trouble breathing occurs then she would need to call 911

## 2023-02-20 NOTE — Telephone Encounter (Signed)
Pt took first dose of xifaxan last night and reports she felt tightness in her chest and muscle spasms that were in her arms and legs, reports the spasms moved around. She has not taken any more of the medication. Please advise.

## 2023-02-21 NOTE — Telephone Encounter (Signed)
Patient called requesting to speak with you again regarding the Xifaxan medication.

## 2023-02-22 ENCOUNTER — Encounter: Payer: Self-pay | Admitting: Internal Medicine

## 2023-02-22 NOTE — Telephone Encounter (Signed)
This has been addressed. See 02/20/23 telephone encounter for details.

## 2023-02-22 NOTE — Telephone Encounter (Signed)
Returned call to patient. Pt resumed Xifaxan and has been doing well. Pt wanted to know when she should expect to see some improvement. I informed patient that she should complete the full course of Rifaximin and update [place] a week after that. It will take some time to see improvement. Pt also wanted to know about her diet. I told pt to slowly reintroduce foods from the different food groups and see how she tolerates it. Pt verbalized understanding and had no concerns at the end of the call.

## 2023-02-28 DIAGNOSIS — M26629 Arthralgia of temporomandibular joint, unspecified side: Secondary | ICD-10-CM | POA: Diagnosis not present

## 2023-03-05 DIAGNOSIS — E43 Unspecified severe protein-calorie malnutrition: Secondary | ICD-10-CM | POA: Diagnosis not present

## 2023-03-05 DIAGNOSIS — Z713 Dietary counseling and surveillance: Secondary | ICD-10-CM | POA: Diagnosis not present

## 2023-03-08 NOTE — Telephone Encounter (Signed)
PT feels that the new medication has had no affect and she is seeking other options. Please advise.

## 2023-03-08 NOTE — Telephone Encounter (Signed)
Patient called again, she is requesting a call back as soon as possible.

## 2023-03-12 ENCOUNTER — Telehealth: Payer: Self-pay | Admitting: Internal Medicine

## 2023-03-12 NOTE — Telephone Encounter (Signed)
I called and spoke to the patient she finished rifaximin for SIBO 1 week ago  She is having considerable bloating immediately after meals; this is her biggest complaint.  If she does not belch she will get pain. She has mostly avoided gluten and dairy  She can get crampy abdominal pain Has lost a little bit more weight  No blood in stool or melena.  No nausea or vomiting  Not sure what to do next  Phone conversation x 25 minutes  Plan: FODMAP diet x 2 weeks Remain gluten-free Email me back after 2 weeks If persistent symptoms consider trial of pancreatic enzymes, upper endoscopy, cross-sectional imaging We discussed aerophagia and recommended some treatments for this possible diagnosis; eat slowly, swallow deliberately without a mouthful of air, avoid carbonated beverages  Time provided for questions and answers and she thanked me for the call

## 2023-03-12 NOTE — Telephone Encounter (Signed)
Inbound call from patient f/u on MyChart message. Advised patient a nurse will f/u with her at their earliest convenience. Informed patient provider was at th hospital all week. Patient advised understanding .

## 2023-03-12 NOTE — Telephone Encounter (Signed)
Patient called regarding previous message. Requesting a call back. Also requesting for her records to be sent over to her PCP. Please advise, thank you.

## 2023-03-12 NOTE — Telephone Encounter (Signed)
Noted  

## 2023-03-13 NOTE — Telephone Encounter (Signed)
Dr. Rhea Belton called and spoke with the pt, see mychart message. Notes sent to PCP.

## 2023-04-04 ENCOUNTER — Encounter: Payer: Self-pay | Admitting: Internal Medicine

## 2023-04-17 DIAGNOSIS — Z713 Dietary counseling and surveillance: Secondary | ICD-10-CM | POA: Diagnosis not present

## 2023-05-07 DIAGNOSIS — M26603 Bilateral temporomandibular joint disorder, unspecified: Secondary | ICD-10-CM | POA: Diagnosis not present

## 2023-05-07 DIAGNOSIS — S01502A Unspecified open wound of oral cavity, initial encounter: Secondary | ICD-10-CM | POA: Diagnosis not present

## 2023-05-14 ENCOUNTER — Ambulatory Visit: Payer: BC Managed Care – PPO | Admitting: Internal Medicine

## 2023-05-14 ENCOUNTER — Encounter: Payer: Self-pay | Admitting: Internal Medicine

## 2023-05-14 VITALS — BP 104/64 | HR 85 | Ht 67.0 in | Wt 131.4 lb

## 2023-05-14 DIAGNOSIS — R140 Abdominal distension (gaseous): Secondary | ICD-10-CM | POA: Diagnosis not present

## 2023-05-14 DIAGNOSIS — K638219 Small intestinal bacterial overgrowth, unspecified: Secondary | ICD-10-CM | POA: Diagnosis not present

## 2023-05-14 DIAGNOSIS — K589 Irritable bowel syndrome without diarrhea: Secondary | ICD-10-CM | POA: Diagnosis not present

## 2023-05-14 NOTE — Progress Notes (Signed)
   Subjective:    Patient ID: Cynthia Farmer, female    DOB: Mar 28, 1986, 37 y.o.   MRN: [id number]  HPI Cynthia Farmer is a 37 year old female with a history of SIBO status posttreatment with rifaximin, gluten-free diet without a definitive diagnosis of celiac disease but with positive celiac DNA, IBS with bloating who is here for follow-up.  She was last seen on 01/22/2023.  She performed the hydrogen breath testing and was positive for SIBO.  She was treated with rifaximin but honestly did not tell much change in her abdominal bloating symptom.  She was also having some persistent nausea.  She then started the FODMAP diet and this has helped tremendously.  She states that is a "very big difference".  She will have some random gas or bloating but very minimum compared to before.  She is starting to slowly reintroduce FODMAP by doing a 3-day challenge and a 2-day break and should be done with this process by late July.  She remains totally gluten-free.   Review of Systems As per HPI, otherwise negative  Current Medications, Allergies, Past Medical History, Past Surgical History, Family History and Social History were reviewed in [employer] record.    Objective:   Physical Exam BP 104/64 (BP Location: Left Arm, Patient Position: Sitting, Cuff Size: Normal)   Pulse 85   Ht 5\' 7"  (1.702 m)   Wt 131 lb 6 oz (59.6 kg)   SpO2 98%   BMI 20.58 kg/m  Gen: awake, alert, NAD HEENT: anicteric  Neuro: nonfocal  Celiac HLA DQ 8 positive Normal CRP Negative TTG while on gluten-free diet Normal liver enzymes Negative H. pylori stool and negative O&P stool     Assessment & Plan:  37 year old female with a history of SIBO status posttreatment with rifaximin, gluten-free diet without a definitive diagnosis of celiac disease but with positive celiac DNA, IBS with bloating who is here for follow-up.  IBS with bloating and SIBO --definitive improvement with the FODMAP diet.  She  is slowly reintroducing some foods which are tolerable.  I suggested she look in to [employer] supplement for when she is traveling or eating away from home. -- Continue FODMAP diet with slow introduction of foods that she tolerates -- Fodzyme enzymatic food supplement which can be purchased over-the-counter and used as needed when eating away from home -- Consider repeat SIBO treatment in the future if symptoms recur on stable diet  2.  Question of celiac disease --difficult to answer even though her TTG was normal because she was already on a gluten-free diet.  Her HLA celiac testing suggests a genetic predisposition but we discussed how this is not diagnostic of celiac disease.  I recommend she continue gluten-free diet until she has stabilized with #1 and then she could consider reintroduction of gluten but would need close monitoring  She will follow-up with me in November or December, sooner if needed  30 minutes total spent today including patient facing time, coordination of care, reviewing medical history/procedures/pertinent radiology studies, and documentation of the encounter.

## 2023-05-14 NOTE — Patient Instructions (Signed)
Remain on a gluten free diet.   Purchase over the [employer] to sprinkle on food.   Low-FODMAP Eating Plan  FODMAP stands for fermentable oligosaccharides, disaccharides, monosaccharides, and polyols. These are sugars that are hard for some people to digest. A low-FODMAP eating plan may help some people who have irritable bowel syndrome (IBS) and certain other bowel (intestinal) diseases to manage their symptoms. This meal plan can be complicated to follow. Work with a diet and nutrition specialist (dietitian) to make a low-FODMAP eating plan that is right for you. A dietitian can help make sure that you get enough nutrition from this diet. What are tips for following this plan? Reading food labels Check labels for hidden FODMAPs such as: High-fructose syrup. Honey. Agave. Natural fruit flavors. Onion or garlic powder. Choose low-FODMAP foods that contain 3-4 grams of fiber per serving. Check food labels for serving sizes. Eat only one serving at a time to make sure FODMAP levels stay low. Shopping Shop with a list of foods that are recommended on this diet and make a meal plan. Meal planning Follow a low-FODMAP eating plan for up to 6 weeks, or as told by your health care provider or dietitian. To follow the eating plan: Eliminate high-FODMAP foods from your diet completely. Choose only low-FODMAP foods to eat. You will do this for 2-6 weeks. Gradually reintroduce high-FODMAP foods into your diet one at a time. Most people should wait a few days before introducing the next new high-FODMAP food into their meal plan. Your dietitian can recommend how quickly you may reintroduce foods. Keep a daily record of what and how much you eat and drink. Make note of any symptoms that you have after eating. Review your daily record with a dietitian regularly to identify which foods you can eat and which foods you should avoid. General tips Drink enough fluid each day to keep your urine pale  yellow. Avoid processed foods. These often have added sugar and may be high in FODMAPs. Avoid most dairy products, whole grains, and sweeteners. Work with a dietitian to make sure you get enough fiber in your diet. Avoid high FODMAP foods at meals to manage symptoms. Recommended foods Fruits Bananas, oranges, tangerines, lemons, limes, blueberries, raspberries, strawberries, grapes, cantaloupe, honeydew melon, kiwi, papaya, passion fruit, and pineapple. Limited amounts of dried cranberries, banana chips, and shredded coconut. Vegetables Eggplant, zucchini, cucumber, peppers, green beans, bean sprouts, lettuce, arugula, kale, Swiss chard, spinach, collard greens, bok choy, summer squash, potato, and tomato. Limited amounts of corn, carrot, and sweet potato. Green parts of scallions. Grains Gluten-free grains, such as rice, oats, buckwheat, quinoa, corn, polenta, and millet. Gluten-free pasta, bread, or cereal. Rice noodles. Corn tortillas. Meats and other proteins Unseasoned beef, pork, poultry, or fish. Eggs. Tomasa Blase. Tofu (firm) and tempeh. Limited amounts of nuts and seeds, such as almonds, walnuts, [place] nuts, pecans, peanuts, nut butters, pumpkin seeds, chia seeds, and sunflower seeds. Dairy Lactose-free milk, yogurt, and kefir. Lactose-free cottage cheese and ice cream. Non-dairy milks, such as almond, coconut, hemp, and rice milk. Non-dairy yogurt. Limited amounts of goat cheese, brie, mozzarella, parmesan, swiss, and other hard cheeses. Fats and oils Butter-free spreads. Vegetable oils, such as olive, canola, and sunflower oil. Seasoning and other foods Artificial sweeteners with names that do not end in "ol," such as aspartame, saccharine, and stevia. Maple syrup, white table sugar, raw sugar, brown sugar, and molasses. Mayonnaise, soy sauce, and tamari. Fresh basil, coriander, parsley, rosemary, and thyme. Beverages Water and mineral  water. Sugar-sweetened soft drinks. Small amounts of  orange juice or cranberry juice. Black and green tea. Most dry wines. Coffee. The items listed above may not be a complete list of foods and beverages you can eat. Contact a dietitian for more information. Foods to avoid Fruits Fresh, dried, and juiced forms of apple, pear, watermelon, peach, plum, cherries, apricots, blackberries, boysenberries, figs, nectarines, and mango. Avocado. Vegetables Chicory root, artichoke, asparagus, cabbage, snow peas, Brussels sprouts, broccoli, sugar snap peas, mushrooms, celery, and cauliflower. Onions, garlic, leeks, and the white part of scallions. Grains Wheat, including kamut, durum, and semolina. Barley and bulgur. Couscous. Wheat-based cereals. Wheat noodles, bread, crackers, and pastries. Meats and other proteins Fried or fatty meat. Sausage. Cashews and pistachios. Soybeans, baked beans, black beans, chickpeas, kidney beans, fava beans, navy beans, lentils, black-eyed peas, and split peas. Dairy Milk, yogurt, ice cream, and soft cheese. Cream and sour cream. Milk-based sauces. Custard. Buttermilk. Soy milk. Seasoning and other foods Any sugar-free gum or candy. Foods that contain artificial sweeteners such as sorbitol, mannitol, isomalt, or xylitol. Foods that contain honey, high-fructose corn syrup, or agave. Bouillon, vegetable stock, beef stock, and chicken stock. Garlic and onion powder. Condiments made with onion, such as hummus, chutney, pickles, relish, salad dressing, and salsa. Tomato paste. Beverages Chicory-based drinks. Coffee substitutes. Chamomile tea. Fennel tea. Sweet or fortified wines such as port or sherry. Diet soft drinks made with isomalt, mannitol, maltitol, sorbitol, or xylitol. Apple, pear, and mango juice. Juices with high-fructose corn syrup. The items listed above may not be a complete list of foods and beverages you should avoid. Contact a dietitian for more information. Summary FODMAP stands for fermentable oligosaccharides,  disaccharides, monosaccharides, and polyols. These are sugars that are hard for some people to digest. A low-FODMAP eating plan is a short-term diet that helps to ease symptoms of certain bowel diseases. The eating plan usually lasts up to 6 weeks. After that, high-FODMAP foods are reintroduced gradually and one at a time. This can help you find out which foods may be causing symptoms. A low-FODMAP eating plan can be complicated. It is best to work with a dietitian who has experience with this type of plan. This information is not intended to replace advice given to you by your health care provider. Make sure you discuss any questions you have with your health care provider. Document Revised: 03/10/2020 Document Reviewed: 03/10/2020 Elsevier Patient Education  2024 Elsevier Inc.  _______________________________________________________  If your blood pressure at your visit was 140/90 or greater, please contact your primary care physician to follow up on this.  _______________________________________________________  If you are age 40 or older, your body mass index should be between 23-30. Your Body mass index is 20.58 kg/m. If this is out of the aforementioned range listed, please consider follow up with your Primary Care Provider.  If you are age 69 or younger, your body mass index should be between 19-25. Your Body mass index is 20.58 kg/m. If this is out of the aformentioned range listed, please consider follow up with your Primary Care Provider.   ________________________________________________________  The [facility] providers would like to encourage you to use [facility] to communicate with providers for non-urgent requests or questions.  Due to long hold times on the telephone, sending your provider a message by [facility] may be a faster and more efficient way to get a response.  Please allow 48 business hours for a response.  Please remember that this is for non-urgent requests.  _______________________________________________________  

## 2023-06-14 DIAGNOSIS — Z13220 Encounter for screening for lipoid disorders: Secondary | ICD-10-CM | POA: Diagnosis not present

## 2023-06-14 DIAGNOSIS — Z0000 Encounter for general adult medical examination without abnormal findings: Secondary | ICD-10-CM | POA: Diagnosis not present

## 2023-06-14 DIAGNOSIS — E559 Vitamin D deficiency, unspecified: Secondary | ICD-10-CM | POA: Diagnosis not present

## 2023-06-14 DIAGNOSIS — K589 Irritable bowel syndrome without diarrhea: Secondary | ICD-10-CM | POA: Diagnosis not present

## 2023-06-19 ENCOUNTER — Ambulatory Visit (INDEPENDENT_AMBULATORY_CARE_PROVIDER_SITE_OTHER): Payer: Self-pay | Admitting: Physician Assistant

## 2023-06-19 DIAGNOSIS — K529 Noninfective gastroenteritis and colitis, unspecified: Secondary | ICD-10-CM

## 2023-06-19 DIAGNOSIS — U071 COVID-19: Secondary | ICD-10-CM

## 2023-06-19 MED ORDER — ONDANSETRON 8 MG PO TBDP
8.0000 mg | ORAL_TABLET | Freq: Three times a day (TID) | ORAL | 0 refills | Status: DC | PRN
Start: 2023-06-19 — End: 2024-02-10

## 2023-06-19 NOTE — Progress Notes (Signed)
Virtual Visit Consent   Cynthia Farmer, you are scheduled for a virtual visit with a [facility] provider today. Just as with appointments in the office, your consent must be obtained to participate. Your consent will be active for this visit and any virtual visit you may have with one of our providers in the next 365 days. If you have a MyChart account, a copy of this consent can be sent to you electronically.  As this is a virtual telephone visit which doesn't allow your provider to perform a traditional examination and may limit your provider's ability to fully assess your condition. If your provider identifies any concerns that need to be evaluated in person or the need to arrange testing (such as labs, EKG, etc.), we will make arrangements to do so. Although advances in technology are sophisticated, we cannot ensure that it will always work on either your end or our end. If the connection with a video visit is poor, the visit may have to be switched to a telephone visit. With either a video or telephone visit, we are not always able to ensure that we have a secure connection.  By engaging in this virtual visit, you consent to the provision of healthcare and authorize for your insurance to be billed (if applicable) for the services provided during this visit. Depending on your insurance coverage, you may receive a charge related to this service.  I need to obtain your verbal consent now. Are you willing to proceed with your visit today? Cynthia Farmer has provided verbal consent on 06/19/2023 for a virtual visit (video or telephone). Gilberto Better, [place]  Date: 06/19/2023 1:05 PM  Virtual Visit via Telephone Note   I, Gilberto Better, connected with  Cynthia Farmer  ([id number], 1986/03/28) on 06/19/23 at 12:30 PM EDT by a telephone and verified that I am speaking with the correct person using two identifiers.  Location: Patient: Virtual Visit Location Patient: Home Provider: Virtual Visit Location  Provider: Office/Clinic   I discussed the limitations of evaluation and management by telemedicine and the availability of in person appointments. The patient expressed understanding and agreed to proceed.    History of Present Illness: Cynthia Farmer is a 37 y.o. who identifies as a female who was assigned female at birth, and is being seen today for upset stomach and testing positive for covid. Marland Kitchen  HPI: 37 y/o F presents for a telehealth visit for slight sore throat, nausea, vomiting, and diarrhea x 2 days. She states her daughter also has similar symptoms. Denies known exposure to covid, however was at a birthday party two days prior to the onset of symptoms. Symptoms were worse yesterday evening so she tested for covid and was positive. She hasn't been able to keep solids in her stomach and has only been drinking water today. Slight fever last night. She denies eating unusual foods or trying a new restaurant. Denies eating undercooked meats. No recent international trip.      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   Extensor tenosynovitis of wrist 06/24/2015   Vaginal delivery 08/09/2014   Third degree perineal laceration 08/09/2014    Allergies:  Allergies  Allergen Reactions   Azithromycin Itching   Ceclor [Cefaclor] Swelling   Amoxicillin Rash   Medications:  Current Outpatient Medications:    ondansetron (ZOFRAN-ODT) 8 MG disintegrating tablet, Take 1 tablet (8 mg total) by mouth every 8 (eight) hours as needed for nausea  or vomiting., Disp: 20 tablet, Rfl: 0   Ascorbic Acid (VITAMIN C) 100 MG CHEW, Vitamin C, Disp: , Rfl:    Cholecalciferol (VITAMIN D) 10 MCG/ML LIQD, Vitamin D, Disp: , Rfl:    XULANE 150-35 MCG/24HR transdermal patch, Place 1 patch onto the skin once a week., Disp: , Rfl:   Observations/Objective: Patient is well-developed, well-nourished in no acute distress.  Resting comfortably at home.   Head is normocephalic, atraumatic.  No labored breathing.  Speech is clear and coherent with logical content.  Patient is alert and oriented at baseline.    Assessment and Plan: 1. COVID-19 virus infection  2. Gastroenteritis - ondansetron (ZOFRAN-ODT) 8 MG disintegrating tablet; Take 1 tablet (8 mg total) by mouth every 8 (eight) hours as needed for nausea or vomiting.  Dispense: 20 tablet; Refill: 0  Discussed treatment for her symptoms.  Drink 1-2 oz of clear fluids every 10 mins as tolerated. If well tolerated then introduce soft foods ie applesauce, rice cereal, toast, bananas, etc. Then progress to regular diet as tolerated. Avoid eating undercooked or uncooked meats. Take Tylenol alternating with Ibuprofen as instructed. Take OTC Imodium as directed on the box.  Take medicine for nausea and vomiting as prescribed. Continue to watch for worsening symptoms. Pt will contact us if her symptoms don't improve or worsen.  Pt verbalized understanding and in agreement. Reviewed CDC guidelines with patient. Pt will be isolated for 5 days from the start of symptoms. Needs to be fever free for 24 hours without the aid of medicine before returning back to work. Must wear a mask for an additional 5 days when at workplace.  She verbalized understanding.   Follow Up Instructions: I discussed the assessment and treatment plan with the patient. The patient was provided an opportunity to ask questions and all were answered. The patient agreed with the plan and demonstrated an understanding of the instructions.  A copy of instructions were sent to the patient via MyChart unless otherwise noted below.    The patient was advised to call back or seek an in-person evaluation if the symptoms worsen or if the condition fails to improve as anticipated.  Time:  I spent 10 minutes with the patient via telehealth technology discussing the above problems/concerns.    Gilberto Better, PA-C

## 2023-07-09 DIAGNOSIS — F4322 Adjustment disorder with anxiety: Secondary | ICD-10-CM | POA: Diagnosis not present

## 2023-07-17 DIAGNOSIS — F4322 Adjustment disorder with anxiety: Secondary | ICD-10-CM | POA: Diagnosis not present

## 2023-08-07 DIAGNOSIS — F4322 Adjustment disorder with anxiety: Secondary | ICD-10-CM | POA: Diagnosis not present

## 2023-08-21 DIAGNOSIS — F4322 Adjustment disorder with anxiety: Secondary | ICD-10-CM | POA: Diagnosis not present

## 2023-08-29 DIAGNOSIS — F4322 Adjustment disorder with anxiety: Secondary | ICD-10-CM | POA: Diagnosis not present

## 2023-09-04 DIAGNOSIS — F4322 Adjustment disorder with anxiety: Secondary | ICD-10-CM | POA: Diagnosis not present

## 2023-09-18 ENCOUNTER — Encounter: Payer: Self-pay | Admitting: Internal Medicine

## 2023-09-18 DIAGNOSIS — F4322 Adjustment disorder with anxiety: Secondary | ICD-10-CM | POA: Diagnosis not present

## 2023-09-19 DIAGNOSIS — D225 Melanocytic nevi of trunk: Secondary | ICD-10-CM | POA: Diagnosis not present

## 2023-09-19 DIAGNOSIS — D2261 Melanocytic nevi of right upper limb, including shoulder: Secondary | ICD-10-CM | POA: Diagnosis not present

## 2023-09-19 DIAGNOSIS — D2262 Melanocytic nevi of left upper limb, including shoulder: Secondary | ICD-10-CM | POA: Diagnosis not present

## 2023-09-19 DIAGNOSIS — D2272 Melanocytic nevi of left lower limb, including hip: Secondary | ICD-10-CM | POA: Diagnosis not present

## 2023-09-23 ENCOUNTER — Ambulatory Visit: Payer: BC Managed Care – PPO | Admitting: Internal Medicine

## 2023-09-24 ENCOUNTER — Other Ambulatory Visit: Payer: Self-pay

## 2023-09-24 DIAGNOSIS — K638219 Small intestinal bacterial overgrowth, unspecified: Secondary | ICD-10-CM

## 2023-09-24 DIAGNOSIS — R143 Flatulence: Secondary | ICD-10-CM

## 2023-09-24 DIAGNOSIS — K589 Irritable bowel syndrome without diarrhea: Secondary | ICD-10-CM

## 2023-09-24 DIAGNOSIS — R140 Abdominal distension (gaseous): Secondary | ICD-10-CM

## 2023-09-24 NOTE — Telephone Encounter (Signed)
Please have pt come for B12, folate, phosphorus, magnesium Please let her know Vit E and A def are very very rare and serum testing is low yield

## 2023-09-26 ENCOUNTER — Other Ambulatory Visit: Payer: Self-pay

## 2023-09-26 DIAGNOSIS — K638219 Small intestinal bacterial overgrowth, unspecified: Secondary | ICD-10-CM

## 2023-09-26 DIAGNOSIS — K589 Irritable bowel syndrome without diarrhea: Secondary | ICD-10-CM

## 2023-09-26 DIAGNOSIS — R140 Abdominal distension (gaseous): Secondary | ICD-10-CM

## 2023-09-26 DIAGNOSIS — R143 Flatulence: Secondary | ICD-10-CM

## 2023-09-28 LAB — PHOSPHORUS: Phosphorus: 3.6 mg/dL (ref 3.0–4.3)

## 2023-09-28 LAB — MAGNESIUM: Magnesium: 2.1 mg/dL (ref 1.6–2.3)

## 2023-09-28 LAB — VITAMIN B12: Vitamin B-12: 387 pg/mL (ref 232–1245)

## 2023-09-28 LAB — FOLATE: Folate: 5.5 ng/mL (ref >3.0)

## 2023-10-02 DIAGNOSIS — F4322 Adjustment disorder with anxiety: Secondary | ICD-10-CM | POA: Diagnosis not present

## 2023-10-23 DIAGNOSIS — F4322 Adjustment disorder with anxiety: Secondary | ICD-10-CM | POA: Diagnosis not present

## 2023-11-13 DIAGNOSIS — F4322 Adjustment disorder with anxiety: Secondary | ICD-10-CM | POA: Diagnosis not present

## 2023-11-20 DIAGNOSIS — F4322 Adjustment disorder with anxiety: Secondary | ICD-10-CM | POA: Diagnosis not present

## 2023-11-27 DIAGNOSIS — F4322 Adjustment disorder with anxiety: Secondary | ICD-10-CM | POA: Diagnosis not present

## 2023-12-11 DIAGNOSIS — F4322 Adjustment disorder with anxiety: Secondary | ICD-10-CM | POA: Diagnosis not present

## 2024-01-01 DIAGNOSIS — F4322 Adjustment disorder with anxiety: Secondary | ICD-10-CM | POA: Diagnosis not present

## 2024-01-08 ENCOUNTER — Ambulatory Visit: Payer: BC Managed Care – PPO | Admitting: Internal Medicine

## 2024-01-30 ENCOUNTER — Telehealth: Payer: Self-pay | Admitting: Internal Medicine

## 2024-01-30 NOTE — Telephone Encounter (Signed)
 Patient called requested to speak with a nurse having SIBO symptoms.

## 2024-01-30 NOTE — Telephone Encounter (Signed)
 Pt states she was dx with SIBO last year. She states that she has noticed that she has noticed a pattern about every other month. States she has episodes of nausea alternating with diarrhea and can't seem to stop this pattern. Pt scheduled to see Boone Master PA 02/07/24 at 1:30pm. Pt aware of appt.

## 2024-02-07 ENCOUNTER — Ambulatory Visit: Admitting: Gastroenterology

## 2024-02-07 ENCOUNTER — Encounter: Payer: Self-pay | Admitting: Gastroenterology

## 2024-02-07 VITALS — BP 104/60 | HR 79 | Ht 67.0 in | Wt 128.0 lb

## 2024-02-07 DIAGNOSIS — K638219 Small intestinal bacterial overgrowth, unspecified: Secondary | ICD-10-CM | POA: Diagnosis not present

## 2024-02-07 DIAGNOSIS — K580 Irritable bowel syndrome with diarrhea: Secondary | ICD-10-CM

## 2024-02-07 MED ORDER — RIFAXIMIN 550 MG PO TABS: 550.0000 mg | ORAL_TABLET | Freq: Three times a day (TID) | ORAL | 0 refills | Status: DC

## 2024-02-07 NOTE — Progress Notes (Signed)
 Chief Complaint: Follow-up Primary GI MD: Dr. Rhea Belton  HPI: 38 year old female with a history of SIBO status posttreatment with rifaximin, gluten-free diet without a definitive diagnosis of celiac disease but with positive celiac DNA, IBS with bloating who is here for follow-up   Last seen 05/2023 but improved.  Had improvement in her IBS with bloating and SIBO on low FODMAP diet and fiber to sign enzymatic food supplement.  Her HLA celiac testing suggested a genetic predisposition but her TTG was normal because she was already on a gluten-free diet and being tested for celiac.  She was recommended to stay on a gluten-free diet and consider reintroduction of gluten with close monitoring  Recently had labs 09/2023 including normal B12, magnesium, phosphorus, folate suggesting that gut is functioning well from absorption and nutritional standpoint  Discussed the use of AI scribe software for clinical note transcription with the patient, who gave verbal consent to proceed.  History of Present Illness  She experiences recurrent episodes of diarrhea and vomiting approximately every two to three months, with the most recent episode occurring two weeks ago. These episodes are characterized by significant gas, including burping, and occur despite adherence to a low FODMAP diet. Previous treatment with rifaximin was ineffective, but she noticed improvement with dietary modifications, identifying and avoiding foods that cause intolerance. Despite these efforts, she continues to experience concerning episodes of diarrhea and vomiting.  She maintains a gluten-free diet and avoids dairy and soy, although she occasionally consumes aged cheese. Eating out is challenging due to her dietary restrictions. She has been mindful of her weight, which was previously decreasing uncontrollably due to fear of eating, but she feels her weight has stabilized recently.  She has experienced increased stress over the past  year, which she believes may contribute to her symptoms. She recalls a particularly stressful day at work preceding one of her episodes but did not identify a specific trigger for the most recent episode. She is concerned about the impact of stress on her condition and is wary of further weight loss. No chronic diarrhea between episodes, and she feels relatively fine in between, though she continues to experience some gas depending on her diet.   Past Medical History:  Diagnosis Date   Concussion 01/17/2023   while figure skating   Hx of varicella    Hyperlipidemia     Past Surgical History:  Procedure Laterality Date   stitches post-3rd degree tear during child birth 2015  2015    Current Outpatient Medications  Medication Sig Dispense Refill   rifaximin (XIFAXAN) 550 MG TABS tablet Take 1 tablet (550 mg total) by mouth 3 (three) times daily. 42 tablet 0   XULANE 150-35 MCG/24HR transdermal patch Place 1 patch onto the skin once a week.     Ascorbic Acid (VITAMIN C) 100 MG CHEW Vitamin C (Patient not taking: Reported on 02/07/2024)     Cholecalciferol (VITAMIN D) 10 MCG/ML LIQD Vitamin D (Patient not taking: Reported on 02/07/2024)     ondansetron (ZOFRAN-ODT) 8 MG disintegrating tablet Take 1 tablet (8 mg total) by mouth every 8 (eight) hours as needed for nausea or vomiting. (Patient not taking: Reported on 02/07/2024) 20 tablet 0   No current facility-administered medications for this visit.    Allergies as of 02/07/2024 - Review Complete 02/07/2024  Allergen Reaction Noted   Azithromycin Itching 01/25/2017   Ceclor [cefaclor] Swelling 08/06/2014   Amoxicillin Rash 08/06/2014    Family History  Problem Relation Age of Onset  Heart disease Father    Heart disease Maternal Grandfather    Heart disease Paternal Grandfather    Colon cancer Neg Hx    Esophageal cancer Neg Hx     Social History   Socioeconomic History   Marital status: Married    Spouse name: Not on file    Number of children: 1   Years of education: Not on file   Highest education level: Not on file  Occupational History   Not on file  Tobacco Use   Smoking status: Never   Smokeless tobacco: Never  Vaping Use   Vaping status: Never Used  Substance and Sexual Activity   Alcohol use: No   Drug use: No   Sexual activity: Yes    Birth control/protection: None, Pill  Other Topics Concern   Not on file  Social History Narrative   Not on file   Social Drivers of Health   Financial Resource Strain: Not on file  Food Insecurity: Not on file  Transportation Needs: Not on file  Physical Activity: Not on file  Stress: Not on file  Social Connections: Not on file  Intimate Partner Violence: Not on file    Review of Systems:    Constitutional: No weight loss, fever, chills, weakness or fatigue HEENT: Eyes: No change in vision               Ears, Nose, Throat:  No change in hearing or congestion Skin: No rash or itching Cardiovascular: No chest pain, chest pressure or palpitations   Respiratory: No SOB or cough Gastrointestinal: See HPI and otherwise negative Genitourinary: No dysuria or change in urinary frequency Neurological: No headache, dizziness or syncope Musculoskeletal: No new muscle or joint pain Hematologic: No bleeding or bruising Psychiatric: No history of depression or anxiety    Physical Exam:  Vital signs: BP 104/60   Pulse 79   Ht 5\' 7"  (1.702 m)   Wt 128 lb (58.1 kg)   BMI 20.05 kg/m   Constitutional: NAD, Well developed, Well nourished, alert and cooperative Head:  Normocephalic and atraumatic. Eyes:   PEERL, EOMI. No icterus. Conjunctiva pink. Respiratory: Respirations even and unlabored. Lungs clear to auscultation bilaterally.   No wheezes, crackles, or rhonchi.  Cardiovascular:  Regular rate and rhythm. No peripheral edema, cyanosis or pallor.  Gastrointestinal:  Soft, nondistended, nontender. No rebound or guarding. Normal bowel sounds. No  appreciable masses or hepatomegaly. Rectal:  Not performed.  Msk:  Symmetrical without gross deformities. Without edema, no deformity or joint abnormality.  Neurologic:  Alert and  oriented x4;  grossly normal neurologically.  Skin:   Dry and intact without significant lesions or rashes. Psychiatric: Oriented to person, place and time. Demonstrates good judgement and reason without abnormal affect or behaviors.  RELEVANT LABS AND IMAGING: CBC    Component Value Date/Time   WBC WILL FOLLOW 02/05/2023 1150   WBC 4.1 01/22/2023 1525   RBC WILL FOLLOW 02/05/2023 1150   RBC 4.76 01/22/2023 1525   HGB WILL FOLLOW 02/05/2023 1150   HCT WILL FOLLOW 02/05/2023 1150   PLT WILL FOLLOW 02/05/2023 1150   MCV WILL FOLLOW 02/05/2023 1150   MCH WILL FOLLOW 02/05/2023 1150   MCH 29.4 02/13/2020 0235   MCHC WILL FOLLOW 02/05/2023 1150   MCHC 33.3 01/22/2023 1525   RDW WILL FOLLOW 02/05/2023 1150   LYMPHSABS WILL FOLLOW 02/05/2023 1150   MONOABS 0.4 01/22/2023 1525   EOSABS WILL FOLLOW 02/05/2023 1150   BASOSABS WILL FOLLOW 02/05/2023  1150    CMP     Component Value Date/Time   NA 137 02/05/2023 1150   K 4.5 02/05/2023 1150   CL 101 02/05/2023 1150   CO2 23 02/05/2023 1150   GLUCOSE 97 02/05/2023 1150   GLUCOSE 86 01/22/2023 1525   BUN 10 02/05/2023 1150   CREATININE 0.69 02/05/2023 1150   CALCIUM 9.1 02/05/2023 1150   PROT 7.2 02/05/2023 1150   ALBUMIN 4.5 02/05/2023 1150   AST 22 02/05/2023 1150   ALT 39 (H) 02/05/2023 1150   ALKPHOS 82 02/05/2023 1150   BILITOT 0.4 02/05/2023 1150   GFRNONAA >60 02/13/2020 0235   GFRAA >60 02/13/2020 0235     Assessment/Plan:   Assessment & Plan  SIBO IBS-D SIBO with periodic diarrhea, vomiting, and gas occurring every other month despite low FODMAP diet, gluten-free, dairy free, soy free diet. Stress may exacerbate symptoms, suggesting gut-brain axis disorder versus SIBO versus IBS-D.  She has tried Xifaxan in the past with minimal  improvement.  She has seen a nutritionist, dietitian, functional medicine doctor, and GI and is still concerned about her GI symptoms. - Trial of Xifaxan 550 mg 3 times daily for 14 days - Monitor weight for stability and nutritional adequacy. - Advised to be aware if another episode occurs and try to identify certain triggers such as stress, foods, environment. - Consider endoscopy and colonoscopy if symptoms persist.   Boone Master, PA-C [facility] 02/07/2024, 2:57 PM  Cc: [name], NP

## 2024-02-07 NOTE — Patient Instructions (Addendum)
 We have sent the following medications to your pharmacy for you to pick up at your convenience: Xifaxan 550mg  take 3 times daily   Follow up as needed _______________________________________________________  If your blood pressure at your visit was 140/90 or greater, please contact your primary care physician to follow up on this.  _______________________________________________________  If you are age 40 or older, your body mass index should be between 23-30. Your Body mass index is 20.05 kg/m. If this is out of the aforementioned range listed, please consider follow up with your Primary Care Provider.  If you are age 57 or younger, your body mass index should be between 19-25. Your Body mass index is 20.05 kg/m. If this is out of the aformentioned range listed, please consider follow up with your Primary Care Provider.   ________________________________________________________  The [facility] providers would like to encourage you to use [facility] to communicate with providers for non-urgent requests or questions.  Due to long hold times on the telephone, sending your provider a message by [facility] may be a faster and more efficient way to get a response.  Please allow 48 business hours for a response.  Please remember that this is for non-urgent requests.  _______________________________________________________  Thank you for trusting me with your gastrointestinal care!   Boone Master, PA

## 2024-02-10 ENCOUNTER — Ambulatory Visit (INDEPENDENT_AMBULATORY_CARE_PROVIDER_SITE_OTHER): Payer: Self-pay | Admitting: Oncology

## 2024-02-10 ENCOUNTER — Encounter: Payer: Self-pay | Admitting: Oncology

## 2024-02-10 ENCOUNTER — Other Ambulatory Visit: Payer: Self-pay

## 2024-02-10 VITALS — BP 117/77 | HR 85 | Temp 97.8°F | Ht 67.0 in | Wt 125.0 lb

## 2024-02-10 DIAGNOSIS — K638219 Small intestinal bacterial overgrowth, unspecified: Secondary | ICD-10-CM

## 2024-02-10 DIAGNOSIS — R051 Acute cough: Secondary | ICD-10-CM

## 2024-02-10 NOTE — Progress Notes (Signed)
 [facility]  [address] [place], [place] [address] Phone: [phone] Fax: [phone]   Office Visit Note  Patient Name: Cynthia Farmer  Date of [id number]:[id number]  Med Rec number [id number]  Date of Service: 02/10/2024  Azithromycin, Ceclor [cefaclor], and Amoxicillin  Chief Complaint  Patient presents with   Cough    Patient reports having a productive cough last week but feels her symptoms have improved since making the appointment. She decided keep the appointment to get an evaluation of her lung sounds.    HPI Patient is an 38 y.o. who is here for URI sx that started two Thursdays ago and then had a lingering cough that was worse in the middle of last week but has improved over the weekend. Was taking tylenol otc. No cough supressants. Feels much better today is not coughing as bad. Cough was worse when she laid down and when she woke up.  Denies history of environmental allergies.  No history of chronic respiratory problems such as asthma.   Diagnosed with SIBO about a year ago.  She has been followed by Dr. Rhea Belton in [place].  Apparently did not respond to treatment and they are recommending she retry treatment given she is still having symptoms.  Reports abdominal bloating and pain intermittently.  Has severe N/V for about a day every 2-3 months.  Reports significantly changing her diet to avoid trigger foods.  Feels exhausted all the time.  She is wondering if we know anybody that might specialize in treatment for SIBO.   Current Medication:  Outpatient Encounter Medications as of 02/10/2024  Medication Sig   XULANE 150-35 MCG/24HR transdermal patch Place 1 patch onto the skin once a week.   rifaximin (XIFAXAN) 550 MG TABS tablet Take 1 tablet (550 mg total) by mouth 3 (three) times daily. (Patient not taking: Reported on 02/10/2024)   [DISCONTINUED] Ascorbic Acid (VITAMIN C) 100 MG CHEW Vitamin C (Patient not taking: Reported on 02/07/2024)    [DISCONTINUED] Cholecalciferol (VITAMIN D) 10 MCG/ML LIQD Vitamin D (Patient not taking: Reported on 02/07/2024)   [DISCONTINUED] ondansetron (ZOFRAN-ODT) 8 MG disintegrating tablet Take 1 tablet (8 mg total) by mouth every 8 (eight) hours as needed for nausea or vomiting. (Patient not taking: Reported on 02/07/2024)   No facility-administered encounter medications on file as of 02/10/2024.      Medical History: Past Medical History:  Diagnosis Date   Concussion 01/17/2023   while figure skating   Hx of varicella    Hyperlipidemia      Vital Signs: BP 117/77   Pulse 85   Temp 97.8 F (36.6 C)   Ht 5\' 7"  (1.702 m)   Wt 125 lb (56.7 kg)   SpO2 99%   BMI 19.58 kg/m   ROS: As per HPI.  All other pertinent ROS negative.     Review of Systems  Constitutional:  Positive for fatigue.  Respiratory:  Positive for cough (Improved).   Gastrointestinal:  Positive for abdominal pain, nausea and vomiting. Negative for constipation.  Allergic/Immunologic: Negative for environmental allergies.    Physical Exam Constitutional:      Appearance: Normal appearance.  Cardiovascular:     Rate and Rhythm: Normal rate and regular rhythm.  Pulmonary:     Effort: Pulmonary effort is normal.     Breath sounds: Normal breath sounds.  Abdominal:     General: Bowel sounds are normal.     Palpations: Abdomen is soft.  Musculoskeletal:  General: No swelling. Normal range of motion.  Neurological:     Mental Status: She is alert and oriented to person, place, and time. Mental status is at baseline.     No results found for this or any previous visit (from the past 24 hours).  Assessment/Plan: 1. Acute cough (Primary) Essentially symptoms have resolved.  We discussed possible URI last week and lingering cough due to environmental allergies.  Recommend taking an antihistamine such as Claritin or Zyrtec in the morning along with Flonase 2 sprays each nostril once or twice daily.  Continue this  through the spring to avoid recurrence.  Please let me know if the cough returns.  2. Small intestinal bacterial overgrowth (SIBO) Currently being followed by Dr. Rhea Belton in [place].  She is looking for a specialist in SIBO.  Will reach out to Dr. Tobi Bastos or Dr. Allegra Lai to see if they are aware of anyone who specializes in SIBO.   General Counseling: lorilee cafarella understanding of the findings of todays visit and agrees with plan of treatment. I have discussed any further diagnostic evaluation that may be needed or ordered today. We also reviewed her medications today. she has been encouraged to call the office with any questions or concerns that should arise related to todays visit.   No orders of the defined types were placed in this encounter.   No orders of the defined types were placed in this encounter.   I spent 20 minutes dedicated to the care of this patient (face-to-face and non-face-to-face) on the date of the encounter to include what is described in the assessment and plan.   Durenda Hurt, NP 02/10/2024 10:36 AM

## 2024-02-17 ENCOUNTER — Telehealth: Payer: Self-pay

## 2024-02-17 ENCOUNTER — Other Ambulatory Visit (HOSPITAL_COMMUNITY): Payer: Self-pay

## 2024-02-17 NOTE — Telephone Encounter (Signed)
 Pharmacy Patient Advocate Encounter   Received notification from CoverMyMeds that prior authorization for Xifaxan 550MG  tablets is required/requested.   Insurance verification completed.   The patient is insured through [name] .   Per test claim: Prior Authorization form/request asks a question that requires your assistance. Please see the question below and advise accordingly. The PA will not be submitted until the necessary information is received.

## 2024-02-17 NOTE — Telephone Encounter (Signed)
 Pharmacy Patient Advocate Encounter   Received notification from CoverMyMeds that prior authorization for Xifaxan 550MG  tablets is required/requested.   Insurance verification completed.   The patient is insured through [facility] .   Per test claim: PA required; PA submitted to above mentioned insurance via CoverMyMeds Key/confirmation #/EOC [id number] Status is pending

## 2024-02-17 NOTE — Telephone Encounter (Signed)
 I am unable to see where patient has previously tried either tricyclic antidepressant or Viberzi for IBS-D.  Thank you for your help

## 2024-02-18 NOTE — Telephone Encounter (Signed)
 Pharmacy Patient Advocate Encounter  Received notification from [facility] that Prior Authorization for Xifaxan 550MG  tablets has been APPROVED from 02-17-2024 to 05-18-2024   PA #/Case ID/Reference #: [id number]

## 2024-03-03 ENCOUNTER — Encounter: Payer: Self-pay | Admitting: Oncology

## 2024-03-04 DIAGNOSIS — F4322 Adjustment disorder with anxiety: Secondary | ICD-10-CM | POA: Diagnosis not present

## 2024-03-06 NOTE — Progress Notes (Signed)
 Addendum: Reviewed and agree with assessment and management plan. Asha Grumbine, Carie Caddy, MD

## 2024-03-09 ENCOUNTER — Ambulatory Visit (INDEPENDENT_AMBULATORY_CARE_PROVIDER_SITE_OTHER): Payer: Self-pay | Admitting: Oncology

## 2024-03-09 ENCOUNTER — Encounter: Payer: Self-pay | Admitting: Oncology

## 2024-03-09 ENCOUNTER — Other Ambulatory Visit: Payer: Self-pay

## 2024-03-09 VITALS — BP 109/55 | HR 94 | Temp 97.6°F | Ht 67.0 in

## 2024-03-09 DIAGNOSIS — K638219 Small intestinal bacterial overgrowth, unspecified: Secondary | ICD-10-CM

## 2024-03-09 DIAGNOSIS — R21 Rash and other nonspecific skin eruption: Secondary | ICD-10-CM

## 2024-03-09 MED ORDER — MUPIROCIN 2 % EX OINT: TOPICAL_OINTMENT | Freq: Two times a day (BID) | CUTANEOUS | 0 refills | Status: DC

## 2024-03-09 NOTE — Progress Notes (Signed)
 Therapist, music and Wellness  [address] [place], [place] [address] Phone: [phone] Fax: [phone]   Office Visit Note  Patient Name: Cynthia Farmer  Date of ZHYQM:[id number]  Med Rec number [id number]  Date of Service: 03/09/2024  Azithromycin, Ceclor [cefaclor], and Amoxicillin  Chief Complaint  Patient presents with   Insect Bite    Patient presents with a large red circle on the left side of her neck. She states she presumes she was bitten by a spider from past experience. She has been applying hydrocortisone cream to the area.    HPI Patient is an 38 y.o. who presents for left sided neck rash that started last Wednesday. Rash is circular and is slightly bigger then initial rash. Has been using hydrocortisone OTC. Taking Claritin.  Thinks it might be from a spider but this is unknown.  It is itchy but otherwise is not painful.  She is followed by Dr. Bridgett Camps for SIBO and is currently finishing Xifaxan  for the second time.  Reports she is interested in finding a SIBO specialist because her symptoms do not appear to improve with antibiotics.  Reports possibly trying an elemental diet where she does liquids for maybe 2 weeks to see if this clears the infection.  Current Medication:  Outpatient Encounter Medications as of 03/09/2024  Medication Sig   loratadine (CLARITIN) 10 MG tablet Take 10 mg by mouth daily.   mupirocin ointment (BACTROBAN) 2 % Apply topically 2 (two) times daily.   XULANE 150-35 MCG/24HR transdermal patch Place 1 patch onto the skin once a week.   rifaximin  (XIFAXAN ) 550 MG TABS tablet Take 1 tablet (550 mg total) by mouth 3 (three) times daily. (Patient not taking: Reported on 03/09/2024)   No facility-administered encounter medications on file as of 03/09/2024.    Medical History: Past Medical History:  Diagnosis Date   Concussion 01/17/2023   while figure skating   Hx of varicella    Hyperlipidemia      Vital Signs: BP (!) 109/55   Pulse 94   Temp  97.6 F (36.4 C)   Ht 5\' 7"  (1.702 m)   SpO2 99%   BMI 19.58 kg/m   ROS: As per HPI.  All other pertinent ROS negative.     Review of Systems  Skin:  Positive for rash.    Physical Exam Skin:    Findings: Rash present. Rash is macular and papular. Rash is not pustular or urticarial.     No results found for this or any previous visit (from the past 24 hours).  Assessment/Plan: 1. Rash (Primary) Unclear etiology but based on patient's timeline and symptoms, this appears to be improving some.  We did take a photo and placed in her chart so that we can monitor if symptoms worsen.  Circular rash measures 4-1/4 inch x 3 and half in with erythema and warmth although not pain full.  We discussed continuing hydrocortisone and will add in mupirocin cream to cover for MRSA and/or cellulitis.  Continue to monitor and let me know if symptoms worsen over the next 4 to 5 days.  - loratadine (CLARITIN) 10 MG tablet; Take 10 mg by mouth daily. - mupirocin ointment (BACTROBAN) 2 %; Apply topically 2 (two) times daily.  Dispense: 22 g; Refill: 0  2. Small intestinal bacterial overgrowth (SIBO) She is followed by Dr. Bridgett Camps gastroenterologist for SIBO.  Reports she is finishing up another round of Xifaxan  but it does not appear to be helping.  She  is looking for a SIBO specialist if there is 1 to help with her symptoms.  Reports she has done a little research and has altered her diet quite a bit.  General Counseling: tirsa sabir understanding of the findings of todays visit and agrees with plan of treatment. I have discussed any further diagnostic evaluation that may be needed or ordered today. We also reviewed her medications today. she has been encouraged to call the office with any questions or concerns that should arise related to todays visit.   No orders of the defined types were placed in this encounter.   Meds ordered this encounter  Medications   mupirocin ointment (BACTROBAN) 2 %     Sig: Apply topically 2 (two) times daily.    Dispense:  22 g    Refill:  0    I spent 25 minutes dedicated to the care of this patient (face-to-face and non-face-to-face) on the date of the encounter to include what is described in the assessment and plan.   Charlton Cooler, NP 03/09/2024 10:37 AM

## 2024-03-24 ENCOUNTER — Encounter: Payer: Self-pay | Admitting: Oncology

## 2024-03-24 ENCOUNTER — Other Ambulatory Visit: Payer: Self-pay

## 2024-03-24 ENCOUNTER — Ambulatory Visit (INDEPENDENT_AMBULATORY_CARE_PROVIDER_SITE_OTHER): Payer: Self-pay | Admitting: Oncology

## 2024-03-24 VITALS — BP 111/83 | HR 77 | Temp 96.9°F | Ht 67.0 in

## 2024-03-24 DIAGNOSIS — R200 Anesthesia of skin: Secondary | ICD-10-CM

## 2024-03-24 DIAGNOSIS — M25561 Pain in right knee: Secondary | ICD-10-CM

## 2024-03-24 DIAGNOSIS — R202 Paresthesia of skin: Secondary | ICD-10-CM

## 2024-03-24 NOTE — Progress Notes (Signed)
 Therapist, music and Wellness  [address] [place], [place] [address] Phone: [phone] Fax: [phone]   Office Visit Note  Patient Name: Cynthia Farmer  Date of ONGEX:[id number]  Med Rec number [id number]  Date of Service: 03/24/2024  Azithromycin, Ceclor [cefaclor], and Amoxicillin  Chief Complaint  Patient presents with   Knee Pain    Patient c/o R knee pain that began yesterday evening. She states it hurts to bear weight on and bend. No known injury.     HPI  Patient is an 38 y.o. who is here for right knee pain.  Reports back of knee pain that started last night around 8 PM.  States she was standing in her kitchen when all of a sudden the back of her knee started to hurt.  She immediately sat down and tried stretching it.  Denies any injury but does report a yoga class yesterday afternoon where she spent a lot of time in child's pose.  Denies previous injury to either knee.  She is not taking anything over-the-counter at this time.  She has not wrapped it or applied ice or heat.  She also reports feeling like there is decreased blood flow to her right foot.  Reports a few months worth of bilateral hand tingling and numbness.  States it is progressively more noticeable but not painful.  Unclear as to what is causing this.  Current Medication:  Outpatient Encounter Medications as of 03/24/2024  Medication Sig   loratadine (CLARITIN) 10 MG tablet Take 10 mg by mouth daily.   mupirocin  ointment (BACTROBAN ) 2 % Apply topically 2 (two) times daily.   XULANE 150-35 MCG/24HR transdermal patch Place 1 patch onto the skin once a week.   [DISCONTINUED] rifaximin  (XIFAXAN ) 550 MG TABS tablet Take 1 tablet (550 mg total) by mouth 3 (three) times daily.   No facility-administered encounter medications on file as of 03/24/2024.    Medical History: Past Medical History:  Diagnosis Date   Concussion 01/17/2023   while figure skating   Hx of varicella    Hyperlipidemia     Vital  Signs: BP 111/83   Pulse 77   Temp (!) 96.9 F (36.1 C)   Ht 5\' 7"  (1.702 m)   SpO2 98%   BMI 19.58 kg/m   ROS: As per HPI.  All other pertinent ROS negative.     Review of Systems  Musculoskeletal:  Positive for arthralgias, gait problem and joint swelling (Right knee).    Physical Exam Cardiovascular:     Pulses:          Popliteal pulses are 3+ on the right side and 3+ on the left side.       Posterior tibial pulses are 3+ on the right side and 3+ on the left side.  Musculoskeletal:     Right knee: No swelling. Normal range of motion. Normal pulse.     Left knee: Normal pulse.     Right lower leg: No edema.     Left lower leg: No edema.     Right foot: No swelling.     Left foot: No swelling.     Comments: Fluid filled cyst behind right knee possibly Baker's cyst versus bursitis.  Pain with extreme flexion and extension.  When knee remains neutral, she denies any pain.  No obvious deformity or swelling.     No results found for this or any previous visit (from the past 24 hours).  Assessment/Plan: 1. Numbness and tingling (Primary)  We discussed patient's past medical history of SIBO and having to completely alter her diet.  We discussed possibly some nutritional deficiencies that could be causing some of the paresthesias she is experiencing in her upper extremities.  Would like to rule out folate, iron, vitamin B12 and vitamin D.  Will send message via MyChart when labs are available to review.  - CBC with Differential/Platelet - Vitamin B12 - Folate - Iron, TIBC and Ferritin Panel - Vitamin D (25 hydroxy)  2. Acute pain of right knee Exam reveals possible Baker's cyst or popliteal cyst.  She has not tried anything OTC yet.  I would recommend high-dose ibuprofen  and stabilization of that right knee given she is having trouble walking.  We provided an adjustable immobilizing brace to help support.  Continue to monitor her symptoms and let me know if they worsen over the  next 2 to 3 days.  Take 800 mg ibuprofen  morning and at night and may ice for comfort.  General Counseling: correne lalani understanding of the findings of todays visit and agrees with plan of treatment. I have discussed any further diagnostic evaluation that may be needed or ordered today. We also reviewed her medications today. she has been encouraged to call the office with any questions or concerns that should arise related to todays visit.   Orders Placed This Encounter  Procedures   CBC with Differential/Platelet   Vitamin B12   Folate   Iron, TIBC and Ferritin Panel   Vitamin D (25 hydroxy)    No orders of the defined types were placed in this encounter.   I spent 20 minutes dedicated to the care of this patient (face-to-face and non-face-to-face) on the date of the encounter to include what is described in the assessment and plan.   Charlton Cooler, NP 03/24/2024 9:12 AM

## 2024-03-25 ENCOUNTER — Ambulatory Visit: Payer: Self-pay | Admitting: Oncology

## 2024-03-25 LAB — CBC WITH DIFFERENTIAL/PLATELET
Basophils Absolute: 0 10*3/uL (ref 0.0–0.2)
Basos: 1 %
EOS (ABSOLUTE): 0.1 10*3/uL (ref 0.0–0.4)
Eos: 2 %
Hematocrit: 42.5 % (ref 34.0–46.6)
Hemoglobin: 13.6 g/dL (ref 11.1–15.9)
Immature Grans (Abs): 0 10*3/uL (ref 0.0–0.1)
Immature Granulocytes: 0 %
Lymphocytes Absolute: 1.7 10*3/uL (ref 0.7–3.1)
Lymphs: 44 %
MCH: 29.2 pg (ref 26.6–33.0)
MCHC: 32 g/dL (ref 31.5–35.7)
MCV: 91 fL (ref 79–97)
Monocytes Absolute: 0.4 10*3/uL (ref 0.1–0.9)
Monocytes: 10 %
Neutrophils Absolute: 1.6 10*3/uL (ref 1.4–7.0)
Neutrophils: 43 %
Platelets: 245 10*3/uL (ref 150–450)
RBC: 4.65 x10E6/uL (ref 3.77–5.28)
RDW: 12.3 % (ref 11.7–15.4)
WBC: 3.7 10*3/uL (ref 3.4–10.8)

## 2024-03-25 LAB — IRON AND TIBC
Iron Saturation: 24 % (ref 15–55)
Iron: 89 ug/dL (ref 27–159)
Total Iron Binding Capacity: 365 ug/dL (ref 250–450)
UIBC: 276 ug/dL (ref 131–425)

## 2024-03-25 LAB — VITAMIN B12: Vitamin B-12: 360 pg/mL (ref 232–1245)

## 2024-03-25 LAB — FERRITIN: Ferritin: 36 ng/mL (ref 15–150)

## 2024-03-25 LAB — FOLATE: Folate: 4.4 ng/mL (ref >3.0)

## 2024-03-25 LAB — VITAMIN D 25 HYDROXY (VIT D DEFICIENCY, FRACTURES): Vit D, 25-Hydroxy: 50.9 ng/mL (ref 30.0–100.0)

## 2024-04-01 DIAGNOSIS — F4322 Adjustment disorder with anxiety: Secondary | ICD-10-CM | POA: Diagnosis not present

## 2024-04-07 ENCOUNTER — Other Ambulatory Visit: Payer: Self-pay

## 2024-04-07 ENCOUNTER — Encounter: Payer: Self-pay | Admitting: Oncology

## 2024-04-07 ENCOUNTER — Ambulatory Visit (INDEPENDENT_AMBULATORY_CARE_PROVIDER_SITE_OTHER): Payer: Self-pay | Admitting: Oncology

## 2024-04-07 VITALS — BP 113/68 | HR 72 | Temp 97.0°F | Ht 67.0 in

## 2024-04-07 DIAGNOSIS — W57XXXA Bitten or stung by nonvenomous insect and other nonvenomous arthropods, initial encounter: Secondary | ICD-10-CM

## 2024-04-07 DIAGNOSIS — S30861A Insect bite (nonvenomous) of abdominal wall, initial encounter: Secondary | ICD-10-CM

## 2024-04-07 DIAGNOSIS — R197 Diarrhea, unspecified: Secondary | ICD-10-CM

## 2024-04-07 MED ORDER — DOXYCYCLINE HYCLATE 100 MG PO TABS: 200.0000 mg | ORAL_TABLET | Freq: Once | ORAL | 0 refills | Status: DC

## 2024-04-07 MED ORDER — DOXYCYCLINE HYCLATE 100 MG PO TABS: 200.0000 mg | ORAL_TABLET | Freq: Once | ORAL | 0 refills | Status: AC

## 2024-04-07 NOTE — Progress Notes (Signed)
 Therapist, [facility] and Wellness  [address] [place], [place] [address] Phone: [phone] Fax: [phone]   Office Visit Note  Patient Name: Cynthia Farmer  Date of ZHYQM:[id number]  Med Rec number [id number]  Date of Service: 04/07/2024  Azithromycin, Ceclor [cefaclor], and Amoxicillin  Chief Complaint  Patient presents with   Acute Visit    Patient states she has a small red bump on her L side from a tick bite. She found the tick on her 3 days ago. She would also like to discuss concerns about high lead levels in her blood from consuming cassava in her diet over the past 2 months. She states she read an article that warned of this which she was not aware of prior to adding it to her diet.   HPI Patient is an 38 y.o. female who is here for a tick bite.  Reports she was bit on Sunday and it was removed a few hours later.  It is on her left torso.  It is left a small red circle and she wanted to have it looked at.  She has not applied any creams.  She also would like to have her blood blood levels checked.  She has been eating a lot of Cassava which is a root vegetable because it does not mess with her stomach as she has SIBO.  Reports she was cooking cassava [place] last night and noticed that it may contain lead.  Upon further research, she started reading consumer reports where several products that she had been consuming are high in lead.  She would like to have her lead levels checked if possible.  Reports that she looked up high levels of blood symptoms and a lot of them were consistent with symptoms she has with her active SIBO diagnosis.  Current Medication:  Outpatient Encounter Medications as of 04/07/2024  Medication Sig   doxycycline (VIBRA-TABS) 100 MG tablet Take 2 tablets (200 mg total) by mouth once for 1 dose.   loratadine (CLARITIN) 10 MG tablet Take 10 mg by mouth daily.   mupirocin  ointment (BACTROBAN ) 2 % Apply topically 2 (two) times daily.   XULANE 150-35 MCG/24HR  transdermal patch Place 1 patch onto the skin once a week.   [DISCONTINUED] doxycycline (VIBRA-TABS) 100 MG tablet Take 2 tablets (200 mg total) by mouth once for 1 dose.   No facility-administered encounter medications on file as of 04/07/2024.      Medical History: Past Medical History:  Diagnosis Date   Concussion 01/17/2023   while figure skating   Hx of varicella    Hyperlipidemia      Vital Signs: BP 113/68   Pulse 72   Temp (!) 97 F (36.1 C)   Ht 5\' 7"  (1.702 m)   SpO2 100%   BMI 19.58 kg/m   ROS: As per HPI.  All other pertinent ROS negative.     Review of Systems  Constitutional:  Negative for chills, diaphoresis, fatigue and fever.  Respiratory:  Negative for cough.   Gastrointestinal:  Positive for abdominal distention, abdominal pain, constipation and diarrhea.  Musculoskeletal:  Positive for myalgias.  Skin:  Positive for rash.    Physical Exam Constitutional:      Appearance: Normal appearance.  Abdominal:     General: Abdomen is flat. Bowel sounds are normal.     Palpations: Abdomen is soft.     Tenderness: There is no abdominal tenderness. There is no right CVA tenderness or left CVA tenderness.  Skin:    Findings: Rash (Left torso-circular) present.  Neurological:     Mental Status: She is alert.     No results found for this or any previous visit (from the past 24 hours).  Assessment/Plan: 1. Diarrhea, unspecified type (Primary) Has history of SIBO.  Just finished treatment for this.  Has been consuming quite a bit of cassava over the past 2 to 3 months.  Would like her lead levels checked.  She will have to go to [facility] to have her heavy metals profile drawn as we do not have the appropriate tubes here.  Also recommend a CMP for kidney and liver damage that can be caused by lead as well as electrolytes.  We will send results via MyChart when available.  - Heavy Metals Profile II, Blood - Comprehensive metabolic panel with GFR  2. Tick bite  of abdomen, initial encounter Discussed prophylactic treatment with 200 mg doxycycline given 1 time within 72 hours of bite.  Recommend taking with food to avoid GI issues.    - doxycycline (VIBRA-TABS) 100 MG tablet; Take 2 tablets (200 mg total) by mouth once for 1 dose.  Dispense: 2 tablet; Refill: 0   General Counseling: [name] verbalizes understanding of the findings of todays visit and agrees with plan of treatment. I have discussed any further diagnostic evaluation that may be needed or ordered today. We also reviewed her medications today. she has been encouraged to call the office with any questions or concerns that should arise related to todays visit.   Orders Placed This Encounter  Procedures   Heavy Metals Profile II, Blood   Comprehensive metabolic panel with GFR    Meds ordered this encounter  Medications   DISCONTD: doxycycline (VIBRA-TABS) 100 MG tablet    Sig: Take 2 tablets (200 mg total) by mouth once for 1 dose.    Dispense:  2 tablet    Refill:  0   doxycycline (VIBRA-TABS) 100 MG tablet    Sig: Take 2 tablets (200 mg total) by mouth once for 1 dose.    Dispense:  2 tablet    Refill:  0    I spent 20 minutes dedicated to the care of this patient (face-to-face and non-face-to-face) on the date of the encounter to include what is described in the assessment and plan.   Charlton Cooler, NP 04/07/2024 11:18 AM

## 2024-04-08 ENCOUNTER — Ambulatory Visit: Payer: Self-pay | Admitting: Oncology

## 2024-04-08 LAB — COMPREHENSIVE METABOLIC PANEL WITH GFR
ALT: 30 IU/L (ref 0–32)
AST: 22 IU/L (ref 0–40)
Albumin: 4.1 g/dL (ref 3.9–4.9)
Alkaline Phosphatase: 89 IU/L (ref 44–121)
BUN/Creatinine Ratio: 14 (ref 9–23)
BUN: 10 mg/dL (ref 6–24)
Bilirubin Total: 0.3 mg/dL (ref 0.0–1.2)
CO2: 19 mmol/L — ABNORMAL LOW (ref 20–29)
Calcium: 9.1 mg/dL (ref 8.7–10.2)
Chloride: 105 mmol/L (ref 96–106)
Creatinine, Ser: 0.71 mg/dL (ref 0.57–1.00)
Globulin, Total: 2.7 g/dL (ref 1.5–4.5)
Glucose: 88 mg/dL (ref 70–99)
Potassium: 4.3 mmol/L (ref 3.5–5.2)
Sodium: 139 mmol/L (ref 134–144)
Total Protein: 6.8 g/dL (ref 6.0–8.5)
eGFR: 110 mL/min/{1.73_m2} (ref >59)

## 2024-04-09 LAB — HEAVY METALS PROFILE II, BLOOD
Arsenic: 1 ug/L (ref 0–9)
Lead, Blood: <1 ug/dL (ref 0.0–3.4)

## 2024-04-13 DIAGNOSIS — Z309 Encounter for contraceptive management, unspecified: Secondary | ICD-10-CM | POA: Diagnosis not present

## 2024-04-16 ENCOUNTER — Ambulatory Visit: Payer: Self-pay | Admitting: Physician Assistant

## 2024-04-16 DIAGNOSIS — R21 Rash and other nonspecific skin eruption: Secondary | ICD-10-CM

## 2024-04-16 DIAGNOSIS — W57XXXD Bitten or stung by nonvenomous insect and other nonvenomous arthropods, subsequent encounter: Secondary | ICD-10-CM

## 2024-04-16 NOTE — Progress Notes (Signed)
 Virtual Visit Consent   Cynthia Farmer, you are scheduled for a virtual visit with a [facility] provider today. Just as with appointments in the office, your consent must be obtained to participate.   I need to obtain your verbal consent now. Are you willing to proceed with your visit today? Cynthia Farmer has provided verbal consent on 04/16/2024 for a virtual visit ( telephone). [address] PA-C  Date: 04/16/2024 9:44 AM  Virtual Visit via Video Note   I, [address] connected with  Cynthia Farmer  ([id number], 08-04-1986) on 04/16/24 at  9:30 AM EDT by telephone and verified that I am speaking with the correct person using two identifiers.  Location: Patient: Virtual Visit Location Patient: Home Provider: Virtual Visit Location Provider: Office/Clinic   I discussed the limitations of evaluation and management by telemedicine and the availability of in person appointments. The patient expressed understanding and agreed to proceed.    History of Present Illness: Cynthia Farmer is a 38 y.o. and is having a virtual visit for f/up on her tick bite. Was seen 04/07/24 for a tick bite 3 days prior, took one dose of Doxy 200mg . States it never went completely away but got a little better, but now it's still a little red and more itchy-- hasn't gotten significantly larger.  Also getting smaller itchy red dots around her umbilicus and upper thigh and arms, but they don't look the same. Wasn't sure if the two things are related. Those are itchy as well.  Used hydrocortisone cream intermittently which helps some. Has been taking claritin chronically/seasonally-- hard to say if that changed anything since she was already using it.  No fevers/chills, body aches, arthralgias, CP, SOB, tongue/lip/face swelling, abd pain/n/v/d, or any other symptoms.   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   Extensor tenosynovitis of  wrist 06/24/2015   Vaginal delivery 08/09/2014   Third degree perineal laceration 08/09/2014    Allergies:  Allergies  Allergen Reactions   Azithromycin Itching   Ceclor [Cefaclor] Swelling   Amoxicillin Rash   Medications:  Current Outpatient Medications:    loratadine (CLARITIN) 10 MG tablet, Take 10 mg by mouth daily., Disp: , Rfl:    mupirocin  ointment (BACTROBAN ) 2 %, Apply topically 2 (two) times daily., Disp: 22 g, Rfl: 0   XULANE 150-35 MCG/24HR transdermal patch, Place 1 patch onto the skin once a week., Disp: , Rfl:   Observations/Objective: No labored breathing.  Speech is clear and coherent with logical content.  Patient is alert and oriented at baseline.    Assessment and Plan: 1. Insect bite, unspecified site, subsequent encounter (Primary)  2. Rash  Pt calling in about her rash, tick bite area still present although not describing any bulls eye or true worsening of rash. Still sounds just like irritant reaction to bite. Also having possibly unrelated rash to umbilicus and thighs/arms. Suspect these are unrelated but without visualization, it's impossible for me to determine. Doubt lyme, pt given appropriate prophylaxis and also not describing rash that would make me suspect lyme.  Advised using hydrocortisone and/or benadryl  cream for a couple days, try switching antihistamine to xyzal for a drug holiday from her claritin. Monitor rash. If worsening or not improving in the next week or so, then come in for an in person appt. Strict ED/return precautions given.    Follow Up Instructions: I discussed the assessment and treatment plan with the patient.  The patient was provided an opportunity to ask questions and all were answered. The patient agreed with the plan and demonstrated an understanding of the instructions.    The patient was advised to call back or seek an in-person evaluation if the symptoms worsen or if the condition fails to improve as  anticipated.  Time:  I spent 15 minutes with the patient via telehealth technology discussing the above problems/concerns.    [address] PA-C

## 2024-05-06 DIAGNOSIS — F4322 Adjustment disorder with anxiety: Secondary | ICD-10-CM | POA: Diagnosis not present

## 2024-05-22 ENCOUNTER — Encounter: Payer: Self-pay | Admitting: Advanced Practice Midwife

## 2024-06-17 DIAGNOSIS — F4322 Adjustment disorder with anxiety: Secondary | ICD-10-CM | POA: Diagnosis not present

## 2024-06-23 DIAGNOSIS — Z0000 Encounter for general adult medical examination without abnormal findings: Secondary | ICD-10-CM | POA: Diagnosis not present

## 2024-06-23 DIAGNOSIS — Z13220 Encounter for screening for lipoid disorders: Secondary | ICD-10-CM | POA: Diagnosis not present

## 2024-06-23 DIAGNOSIS — Z23 Encounter for immunization: Secondary | ICD-10-CM | POA: Diagnosis not present

## 2024-06-23 DIAGNOSIS — E559 Vitamin D deficiency, unspecified: Secondary | ICD-10-CM | POA: Diagnosis not present

## 2024-06-27 DIAGNOSIS — Z1231 Encounter for screening mammogram for malignant neoplasm of breast: Secondary | ICD-10-CM | POA: Diagnosis not present

## 2024-08-17 ENCOUNTER — Other Ambulatory Visit: Payer: Self-pay

## 2024-08-17 ENCOUNTER — Telehealth: Payer: Self-pay | Admitting: Gastroenterology

## 2024-08-17 DIAGNOSIS — K580 Irritable bowel syndrome with diarrhea: Secondary | ICD-10-CM

## 2024-08-17 DIAGNOSIS — K638219 Small intestinal bacterial overgrowth, unspecified: Secondary | ICD-10-CM

## 2024-08-17 NOTE — Telephone Encounter (Signed)
 We can repeat CBC and CMP prior to her office visit SIBO does not have a specific blood test but breath test will need to be repeated.  If she feels like her symptoms are recurrent we can repeat breath testing as well.

## 2024-08-17 NOTE — Telephone Encounter (Signed)
 Spoke with pt and she is aware and will come for labs prior to her OV, orders are in epic.

## 2024-08-17 NOTE — Telephone Encounter (Signed)
 Pt last seen in April and later dx with SIBO. She was treated with Xifaxan . Pt states she has noticed clay colored stools and is concerned that she may have sibo again.She has a f/u appt scheduled in December but wondered if she needed to have any blood work or stool tests done prior to the appt. Please advise.

## 2024-08-17 NOTE — Telephone Encounter (Signed)
 PT requesting to speak with a nurse to find out if she can have a stool sample prior to her appointment. Please advise.

## 2024-09-01 ENCOUNTER — Other Ambulatory Visit: Payer: Self-pay

## 2024-09-01 ENCOUNTER — Encounter: Payer: Self-pay | Admitting: Medical

## 2024-09-01 ENCOUNTER — Ambulatory Visit: Payer: Self-pay | Admitting: Medical

## 2024-09-01 VITALS — BP 110/80 | HR 64 | Temp 97.2°F | Ht 67.0 in | Wt 128.0 lb

## 2024-09-01 DIAGNOSIS — W60XXXA Contact with nonvenomous plant thorns and spines and sharp leaves, initial encounter: Secondary | ICD-10-CM

## 2024-09-01 DIAGNOSIS — K638219 Small intestinal bacterial overgrowth, unspecified: Secondary | ICD-10-CM

## 2024-09-01 DIAGNOSIS — R197 Diarrhea, unspecified: Secondary | ICD-10-CM

## 2024-09-01 DIAGNOSIS — S60459A Superficial foreign body of unspecified finger, initial encounter: Secondary | ICD-10-CM

## 2024-09-01 DIAGNOSIS — K580 Irritable bowel syndrome with diarrhea: Secondary | ICD-10-CM

## 2024-09-01 NOTE — Progress Notes (Signed)
 [facility] [address] [place], [place] [address] Phone: [phone] Fax: [phone]   Office Visit Note  Patient Name: Cynthia Farmer  Date of Apmuy:978014  Med Rec number [id number]  Date of Service: 09/01/2024  Allergies: Azithromycin, Ceclor [cefaclor], and Amoxicillin  Chief Complaint  Patient presents with   Acute Visit    Patient states she touched a cactus about 10 days ago and it feels like a prick is in her finger. (R ring finger) No redness or swelling around the prick site.     HPI 38 y.o. female presents with right 4th finger pain.  Touched a cactus in her front yard about a week ago. Cactus did not have large spines visible to the naked eye. Experienced pain right away to one spot on lateral side of finger. Pain gradually faded within an hour. Had some redness initially, has faded. No swelling. Washed wound and used tweezers to try to remove any foreign material. Did get a small FB out and felt some relief. Continues to have some pain if pushes on it or hits it just right. Denies fever, chills, nausea, myalgia, headache or other systemic symptoms.  Also notes she has continued to experience episodes of abdominal pain and diarrhea. She is under care of gastroenterology. Received course of treatment for SIBO. Also dx with IBS-D. She is frustrated that she still experiences sx despite tx for SIBO. Has F/U appt upcoming in Dec. Here today for blood work ordered by GI. From telephone note visible in Epic, sounds like plan is to repeat breath testing for SIBO at F/U appt.  Current Medication:  Outpatient Encounter Medications as of 09/01/2024  Medication Sig   loratadine (CLARITIN) 10 MG tablet Take 10 mg by mouth daily. (Patient taking differently: Take 10 mg by mouth daily as needed.)   Olopatadine HCl (PATADAY) 0.2 % SOLN Place 1 drop into both eyes daily as needed.   XULANE 150-35 MCG/24HR transdermal patch Place 1 patch onto the skin once a week.    [DISCONTINUED] mupirocin  ointment (BACTROBAN ) 2 % Apply topically 2 (two) times daily.   No facility-administered encounter medications on file as of 09/01/2024.      Medical History: Past Medical History:  Diagnosis Date   Concussion 01/17/2023   while figure skating   Hx of varicella    Hyperlipidemia      Vital Signs: BP 110/80   Pulse 64   Temp (!) 97.2 F (36.2 C)   Ht 5' 7 (1.702 m)   Wt 128 lb (58.1 kg)   SpO2 100%   BMI 20.05 kg/m    Review of Systems  Constitutional:  Negative for chills, fatigue and fever.  Gastrointestinal:  Positive for abdominal pain and diarrhea. Negative for nausea and vomiting.  Musculoskeletal:  Negative for myalgias and neck stiffness.  Skin:  Positive for wound. Negative for rash.  Neurological:  Negative for headaches.    Physical Exam Vitals reviewed.  Constitutional:      General: She is not in acute distress.    Appearance: She is not ill-appearing.  Skin:    Findings: Wound present.     Comments: Tiny pinpoint papule surrounding puncture wound to lateral aspect of distal right 4th finger. No erythema, swelling, discoloration or drainage from wound. No visible foreign body.  Neurological:     Mental Status: She is alert.       Assessment/Plan: 1. Finger, superficial foreign body (splinter), initial encounter (Primary) 2. Contact with cactus Exam and  pain patient reports suggests possible small foreign body to finger. No evidence of infection. Per record, had Tdap 06/2024. After cleaning finger with alcohol swab, attempted to express FB with pressure applied around wound and using pair of clean forceps without success. Recommended warm soaks daily and further attempts to remove FB at home. Discussed FB may eventually be expelled with time. Patient encouraged to message/call or schedule follow up visit as needed with any signs of infection (i.e. worsening pain/redness, fever, drainage).  3. Diarrhea, unspecified  type Encouraged to keep follow up appt with GI and push for SIBO retesting. Encouraged to ask about other possible causes for sx or treatment option for her IBS. Discussed option to seek second opinion with another GI if desired.    Patient Instructions  -Soak your finger in warm/soapy water for 10-15 minutes at least once a day. While skin is soft, continue attempting to expel the spine or remove it with tweezers. -Message/call or schedule follow up visit as needed with any signs of infection (i.e. worsening pain/redness, fever, drainage).  -Keep follow up appt with GI and request SIBO retesting. Ask about other possible causes for sx and treatment options for IBS. You may seek a second opinion with another GI if desired.   General Counseling: yasamin karel understanding of the findings of todays visit and plan of treatment. she has been encouraged to call the office with any questions or concerns that should arise related to todays visit.    Time spent:25 Minutes    Joen Arts PA-C [facility] 09/01/2024 9:40 AM

## 2024-09-02 ENCOUNTER — Ambulatory Visit: Payer: Self-pay | Admitting: Internal Medicine

## 2024-09-02 LAB — COMPREHENSIVE METABOLIC PANEL WITH GFR
ALT: 15 IU/L (ref 0–32)
AST: 16 IU/L (ref 0–40)
Albumin: 4.3 g/dL (ref 3.9–4.9)
Alkaline Phosphatase: 76 IU/L (ref 41–116)
BUN/Creatinine Ratio: 14 (ref 9–23)
BUN: 12 mg/dL (ref 6–24)
Bilirubin Total: 0.5 mg/dL (ref 0.0–1.2)
CO2: 23 mmol/L (ref 20–29)
Calcium: 9.2 mg/dL (ref 8.7–10.2)
Chloride: 103 mmol/L (ref 96–106)
Creatinine, Ser: 0.83 mg/dL (ref 0.57–1.00)
Globulin, Total: 2.8 g/dL (ref 1.5–4.5)
Glucose: 86 mg/dL (ref 70–99)
Potassium: 4.4 mmol/L (ref 3.5–5.2)
Sodium: 137 mmol/L (ref 134–144)
Total Protein: 7.1 g/dL (ref 6.0–8.5)
eGFR: 91 mL/min/1.73 (ref >59)

## 2024-09-02 LAB — CBC WITH DIFFERENTIAL/PLATELET
Basophils Absolute: 0 x10E3/uL (ref 0.0–0.2)
Basos: 1 %
EOS (ABSOLUTE): 0.1 x10E3/uL (ref 0.0–0.4)
Eos: 2 %
Hematocrit: 39.9 % (ref 34.0–46.6)
Hemoglobin: 13.3 g/dL (ref 11.1–15.9)
Immature Grans (Abs): 0 x10E3/uL (ref 0.0–0.1)
Immature Granulocytes: 0 %
Lymphocytes Absolute: 1.8 x10E3/uL (ref 0.7–3.1)
Lymphs: 57 %
MCH: 30 pg (ref 26.6–33.0)
MCHC: 33.3 g/dL (ref 31.5–35.7)
MCV: 90 fL (ref 79–97)
Monocytes Absolute: 0.2 x10E3/uL (ref 0.1–0.9)
Monocytes: 7 %
Neutrophils Absolute: 1 x10E3/uL — ABNORMAL LOW (ref 1.4–7.0)
Neutrophils: 33 %
Platelets: 270 x10E3/uL (ref 150–450)
RBC: 4.44 x10E6/uL (ref 3.77–5.28)
RDW: 12.5 % (ref 11.7–15.4)
WBC: 3.1 x10E3/uL — ABNORMAL LOW (ref 3.4–10.8)

## 2024-09-06 NOTE — Patient Instructions (Addendum)
-  Soak your finger in warm/soapy water for 10-15 minutes at least once a day. While skin is soft, continue attempting to expel the spine or remove it with tweezers. -Message/call or schedule follow up visit as needed with any signs of infection (i.e. worsening pain/redness, fever, drainage).  -Keep follow up appt with GI and request SIBO retesting. Ask about other possible causes for sx and treatment options for IBS. You may seek a second opinion with another GI if desired.

## 2024-09-09 DIAGNOSIS — R140 Abdominal distension (gaseous): Secondary | ICD-10-CM | POA: Diagnosis not present

## 2024-09-09 DIAGNOSIS — K638219 Small intestinal bacterial overgrowth, unspecified: Secondary | ICD-10-CM | POA: Diagnosis not present

## 2024-09-09 DIAGNOSIS — R1084 Generalized abdominal pain: Secondary | ICD-10-CM | POA: Diagnosis not present

## 2024-09-09 DIAGNOSIS — K580 Irritable bowel syndrome with diarrhea: Secondary | ICD-10-CM | POA: Diagnosis not present

## 2024-09-14 DIAGNOSIS — R140 Abdominal distension (gaseous): Secondary | ICD-10-CM | POA: Diagnosis not present

## 2024-09-21 DIAGNOSIS — F4322 Adjustment disorder with anxiety: Secondary | ICD-10-CM | POA: Diagnosis not present

## 2024-09-24 DIAGNOSIS — D2261 Melanocytic nevi of right upper limb, including shoulder: Secondary | ICD-10-CM | POA: Diagnosis not present

## 2024-09-24 DIAGNOSIS — D225 Melanocytic nevi of trunk: Secondary | ICD-10-CM | POA: Diagnosis not present

## 2024-09-24 DIAGNOSIS — D2272 Melanocytic nevi of left lower limb, including hip: Secondary | ICD-10-CM | POA: Diagnosis not present

## 2024-09-24 DIAGNOSIS — D2262 Melanocytic nevi of left upper limb, including shoulder: Secondary | ICD-10-CM | POA: Diagnosis not present

## 2024-10-07 ENCOUNTER — Ambulatory Visit: Admitting: Gastroenterology

## 2024-11-18 ENCOUNTER — Other Ambulatory Visit: Payer: Self-pay

## 2024-11-18 ENCOUNTER — Encounter: Payer: Self-pay | Admitting: Medical

## 2024-11-18 ENCOUNTER — Ambulatory Visit: Payer: Self-pay | Admitting: Medical

## 2024-11-18 VITALS — BP 112/80 | HR 95 | Temp 99.0°F | Ht 67.0 in | Wt 128.0 lb

## 2024-11-18 DIAGNOSIS — M791 Myalgia, unspecified site: Secondary | ICD-10-CM

## 2024-11-18 DIAGNOSIS — R6883 Chills (without fever): Secondary | ICD-10-CM

## 2024-11-18 DIAGNOSIS — R5383 Other fatigue: Secondary | ICD-10-CM

## 2024-11-18 LAB — POC SOFIA 2 FLU + SARS ANTIGEN FIA
Influenza A, POC: NEGATIVE
Influenza B, POC: NEGATIVE
SARS Coronavirus 2 Ag: NEGATIVE

## 2024-11-18 NOTE — Patient Instructions (Addendum)
-  Rest and stay well hydrated (by drinking water and other liquids). Avoid alcohol. -Take over-the-counter medicines (i.e. Tylenol /Acetaminophen , Ibuprofen ) to help relieve your symptoms. You may alternate these every 3-4 hours if needed. -Consider scheduling a follow up visit for some labs if your symptoms do not improve by Monday, 11/23/24.  -Call, schedule follow up visit or visit urgent care if new/worsening symptoms (i.e. documented fevers over 100.4, vomiting, abdominal pain, severe headache or stiff neck, etc.) develop in meantime.

## 2024-11-18 NOTE — Progress Notes (Signed)
 [facility] [address] [place], [place] [address]   Office Visit Note  Patient Name: Cynthia Farmer Date of Birth [birth year]  Medical Record number [id number]  Date of Service: 11/18/2024  Chief Complaint  Patient presents with   Acute Visit    Patient c/o body aches, chills, and fatigue. She has not been sleeping well due to feeling so cold. Denies fever, sore throat, and nasal congestion. Symptoms began about 6 days ago and progressed over time. Her daughter was also sick but tested negative for flu/strept at her dr. appt.      HPI Discussed the use of AI scribe software for clinical note transcription with the patient, who gave verbal consent to proceed.  History of Present Illness Colletta Spillers is a 39 year old female who presents with fatigue, chills, and body aches.  Constitutional symptoms - Fatigue, chills, and generalized body aches since Saturday - Chills recur approximately every six hours, especially bothersome at night when medicine wears off, disrupting sleep - Intermittent episodes of feeling very hot, similar to hot flashes - Home temperature checks before taking Tylenol  have been normal or slightly low - Tylenol  provides temporary relief of aches and chills, but symptoms return  Gastrointestinal symptoms - Mild constipation without other bowel changes - No nausea, vomiting, or diarrhea  Respiratory symptoms - No sore throat, cough, sneezing, or runny nose  Neurological symptoms - Pressure sensation in head when bending over, no nasal discharge or PND - No headache  Genitourinary symptoms - No urinary symptoms  Recent travel and exposure - Recent travel to the [place] over [place], does not recall getting many mosquito bites or any tick bites - Daughter has sore throat with negative flu and strep testing and no fever   Current Medication:  Outpatient Encounter Medications as of 11/18/2024  Medication Sig    loratadine (CLARITIN) 10 MG tablet Take 10 mg by mouth daily.   Olopatadine HCl (PATADAY) 0.2 % SOLN Place 1 drop into both eyes daily as needed.   XULANE 150-35 MCG/24HR transdermal patch Place 1 patch onto the skin once a week.   No facility-administered encounter medications on file as of 11/18/2024.      Medical History: Past Medical History:  Diagnosis Date   Concussion 01/17/2023   while figure skating   Hx of varicella    Hyperlipidemia      Vital Signs: BP 112/80   Pulse 95   Temp 99 F (37.2 C)   Ht 5' 7 (1.702 m)   Wt 128 lb (58.1 kg)   SpO2 100%   BMI 20.05 kg/m    Review of Systems  Constitutional:  Positive for chills and fatigue. Negative for appetite change and fever.  HENT:  Positive for sinus pressure. Negative for congestion, rhinorrhea, sore throat and trouble swallowing.   Respiratory:  Negative for cough and shortness of breath.   Gastrointestinal:  Negative for diarrhea, nausea and vomiting.  Musculoskeletal:  Positive for myalgias. Negative for neck pain and neck stiffness.  Neurological:  Negative for headaches.    Physical Exam Vitals reviewed.  Constitutional:      General: She is not in acute distress.    Appearance: She is not ill-appearing.  HENT:     Head: Normocephalic.     Right Ear: Tympanic membrane, ear canal and external ear normal.     Left Ear: Tympanic membrane, ear canal and external ear normal.     Nose: Mucosal edema (  mild with mild erythema) present. No congestion or rhinorrhea.     Right Sinus: No maxillary sinus tenderness or frontal sinus tenderness.     Left Sinus: No maxillary sinus tenderness or frontal sinus tenderness.     Mouth/Throat:     Mouth: Mucous membranes are moist. No oral lesions.     Pharynx: No pharyngeal swelling or posterior oropharyngeal erythema.     Tonsils: No tonsillar exudate. 0 on the right. 0 on the left.  Cardiovascular:     Rate and Rhythm: Normal rate and regular rhythm.     Heart  sounds: No murmur heard.    No friction rub. No gallop.  Pulmonary:     Effort: Pulmonary effort is normal.     Breath sounds: Normal breath sounds. No wheezing, rhonchi or rales.  Abdominal:     General: Bowel sounds are normal. There is no distension.     Palpations: Abdomen is soft.     Tenderness: There is no abdominal tenderness. There is no right CVA tenderness, left CVA tenderness or guarding.  Musculoskeletal:     Cervical back: Neck supple. No rigidity.  Lymphadenopathy:     Cervical: No cervical adenopathy.  Neurological:     Mental Status: She is alert.    Results for orders placed or performed in visit on 11/18/24 (from the past 24 hours)  POC SOFIA 2 FLU + SARS ANTIGEN FIA     Status: Normal   Collection Time: 11/18/24 11:33 AM  Result Value Ref Range   Influenza A, POC Negative Negative   Influenza B, POC Negative Negative   SARS Coronavirus 2 Ag Negative Negative      Assessment/Plan: 1. Myalgia (Primary) 2. Chills (without fever) 3. Fatigue, unspecified type  - POC SOFIA 2 FLU + SARS ANTIGEN FIA Assessment & Plan Acute viral syndrome with myalgia, chills and fatigue Symptoms mild, suspect viral illness, likely self-limiting. Negative for flu and COVID. Discussed supportive care and expectation for symptom improvement over next 7-10 days. - Continue rest and hydration. - Alternate acetaminophen  with ibuprofen , take ibuprofen  with food. - Monitor symptoms, discussed blood work if no improvement to symptoms by Monday 11/23/24. - Call, schedule earlier visit or seek urgent care if symptoms change/significantly worsen (i.e. documented fevers over 100.4, vomiting, abdominal pain, severe headache or stiff neck, etc.) before Monday.    General Counseling: huxley vanwagoner understanding of the findings of todays visit and plan of treatment. she has been encouraged to call the office with any questions or concerns that should arise related to todays  visit.    Time spent:20 Minutes    Joen Arts PA-C Physician Assistant
